# Patient Record
Sex: Female | Born: 1935 | Race: White | Hispanic: No | Marital: Married
Health system: Southern US, Community
[De-identification: ages and names within clinical notes are randomized; demographics above are authoritative.]

## PROBLEM LIST (undated history)

## (undated) DIAGNOSIS — E119 Type 2 diabetes mellitus without complications: Secondary | ICD-10-CM

## (undated) DIAGNOSIS — I509 Heart failure, unspecified: Secondary | ICD-10-CM

## (undated) DIAGNOSIS — J449 Chronic obstructive pulmonary disease, unspecified: Secondary | ICD-10-CM

## (undated) DIAGNOSIS — H9190 Unspecified hearing loss, unspecified ear: Secondary | ICD-10-CM

## (undated) DIAGNOSIS — I252 Old myocardial infarction: Secondary | ICD-10-CM

## (undated) HISTORY — PX: BACK SURGERY: SHX140

## (undated) HISTORY — PX: CORONARY STENT PLACEMENT: SHX1402

## (undated) HISTORY — PX: REPLACEMENT TOTAL KNEE: SUR1224

## (undated) HISTORY — PX: CARDIAC SURGERY: SHX584

---

## 2004-09-18 ENCOUNTER — Inpatient Hospital Stay: Payer: Self-pay | Admitting: General Surgery

## 2006-01-02 ENCOUNTER — Ambulatory Visit: Payer: Self-pay | Admitting: Pediatrics

## 2007-02-12 ENCOUNTER — Ambulatory Visit: Payer: Self-pay | Admitting: *Deleted

## 2007-05-21 ENCOUNTER — Ambulatory Visit: Payer: Self-pay | Admitting: Gastroenterology

## 2008-01-15 ENCOUNTER — Ambulatory Visit: Payer: Self-pay | Admitting: Family Medicine

## 2008-04-12 ENCOUNTER — Ambulatory Visit: Payer: Self-pay | Admitting: Unknown Physician Specialty

## 2008-04-12 ENCOUNTER — Ambulatory Visit: Payer: Self-pay | Admitting: Cardiology

## 2008-04-19 ENCOUNTER — Inpatient Hospital Stay: Payer: Self-pay | Admitting: Unknown Physician Specialty

## 2008-04-25 ENCOUNTER — Encounter: Payer: Self-pay | Admitting: Internal Medicine

## 2009-03-08 ENCOUNTER — Inpatient Hospital Stay: Payer: Self-pay | Admitting: Internal Medicine

## 2009-03-22 ENCOUNTER — Ambulatory Visit: Payer: Self-pay | Admitting: Unknown Physician Specialty

## 2009-05-25 ENCOUNTER — Ambulatory Visit: Payer: Self-pay | Admitting: Family Medicine

## 2009-05-31 ENCOUNTER — Ambulatory Visit: Payer: Self-pay | Admitting: Gastroenterology

## 2009-07-05 ENCOUNTER — Ambulatory Visit: Payer: Self-pay | Admitting: Unknown Physician Specialty

## 2009-07-18 ENCOUNTER — Inpatient Hospital Stay: Payer: Self-pay | Admitting: Unknown Physician Specialty

## 2010-10-05 ENCOUNTER — Ambulatory Visit: Payer: Self-pay | Admitting: Gastroenterology

## 2010-11-22 ENCOUNTER — Ambulatory Visit: Payer: Self-pay | Admitting: Gastroenterology

## 2010-11-29 LAB — PATHOLOGY REPORT

## 2010-12-27 ENCOUNTER — Ambulatory Visit: Payer: Self-pay | Admitting: Family Medicine

## 2011-01-24 ENCOUNTER — Ambulatory Visit: Payer: Self-pay | Admitting: Gastroenterology

## 2011-12-09 ENCOUNTER — Emergency Department: Payer: Self-pay | Admitting: Emergency Medicine

## 2011-12-09 LAB — COMPREHENSIVE METABOLIC PANEL
Alkaline Phosphatase: 126 U/L (ref 50–136)
Anion Gap: 9 (ref 7–16)
Bilirubin,Total: 0.5 mg/dL (ref 0.2–1.0)
Calcium, Total: 8.6 mg/dL (ref 8.5–10.1)
Chloride: 104 mmol/L (ref 98–107)
Co2: 26 mmol/L (ref 21–32)
EGFR (African American): 54 — ABNORMAL LOW
EGFR (Non-African Amer.): 47 — ABNORMAL LOW
Glucose: 190 mg/dL — ABNORMAL HIGH (ref 65–99)
Osmolality: 286 (ref 275–301)
Potassium: 3.9 mmol/L (ref 3.5–5.1)
SGOT(AST): 32 U/L (ref 15–37)
Sodium: 139 mmol/L (ref 136–145)

## 2011-12-09 LAB — TROPONIN I: Troponin-I: <0.02 ng/mL

## 2011-12-09 LAB — CBC
MCHC: 31.5 g/dL — ABNORMAL LOW (ref 32.0–36.0)
MCV: 86 fL (ref 80–100)
Platelet: 283 10*3/uL (ref 150–440)
RDW: 14.9 % — ABNORMAL HIGH (ref 11.5–14.5)
WBC: 7.4 10*3/uL (ref 3.6–11.0)

## 2011-12-09 LAB — URINALYSIS, COMPLETE
Glucose,UR: 50 mg/dL (ref 0–75)
Ketone: NEGATIVE
Nitrite: NEGATIVE
Ph: 5 (ref 4.5–8.0)
Protein: 100
RBC,UR: 1 /HPF (ref 0–5)
Squamous Epithelial: 6
WBC UR: 15 /HPF (ref 0–5)

## 2011-12-09 LAB — CK TOTAL AND CKMB (NOT AT ARMC)
CK, Total: 82 U/L (ref 21–215)
CK-MB: 1.1 ng/mL (ref 0.5–3.6)

## 2012-02-06 ENCOUNTER — Ambulatory Visit: Payer: Self-pay | Admitting: Family Medicine

## 2012-05-02 ENCOUNTER — Emergency Department: Payer: Self-pay | Admitting: Emergency Medicine

## 2012-12-31 ENCOUNTER — Emergency Department: Payer: Self-pay | Admitting: Emergency Medicine

## 2012-12-31 LAB — CBC
HCT: 40 % (ref 35.0–47.0)
HGB: 13.3 g/dL (ref 12.0–16.0)
Platelet: 285 10*3/uL (ref 150–440)
RBC: 4.77 10*6/uL (ref 3.80–5.20)
RDW: 14.6 % — ABNORMAL HIGH (ref 11.5–14.5)
WBC: 9.5 10*3/uL (ref 3.6–11.0)

## 2012-12-31 LAB — COMPREHENSIVE METABOLIC PANEL
Albumin: 3.3 g/dL — ABNORMAL LOW (ref 3.4–5.0)
Alkaline Phosphatase: 136 U/L (ref 50–136)
Anion Gap: 3 — ABNORMAL LOW (ref 7–16)
BUN: 17 mg/dL (ref 7–18)
Bilirubin,Total: 0.5 mg/dL (ref 0.2–1.0)
Calcium, Total: 9.4 mg/dL (ref 8.5–10.1)
Chloride: 104 mmol/L (ref 98–107)
EGFR (Non-African Amer.): 53 — ABNORMAL LOW
Glucose: 147 mg/dL — ABNORMAL HIGH (ref 65–99)
Potassium: 3.7 mmol/L (ref 3.5–5.1)
SGOT(AST): 26 U/L (ref 15–37)
Sodium: 139 mmol/L (ref 136–145)
Total Protein: 7.2 g/dL (ref 6.4–8.2)

## 2012-12-31 LAB — URINALYSIS, COMPLETE
Bacteria: NONE SEEN
Bilirubin,UR: NEGATIVE
Glucose,UR: 50 mg/dL (ref 0–75)
Nitrite: NEGATIVE
Protein: >=500
Specific Gravity: 1.019 (ref 1.003–1.030)
WBC UR: 297 /HPF (ref 0–5)

## 2012-12-31 LAB — TROPONIN I: Troponin-I: <0.02 ng/mL

## 2013-03-30 ENCOUNTER — Ambulatory Visit: Payer: Self-pay | Admitting: Family Medicine

## 2013-04-03 LAB — CBC
HCT: 38.8 % (ref 35.0–47.0)
MCH: 28.6 pg (ref 26.0–34.0)
MCHC: 33.9 g/dL (ref 32.0–36.0)
RBC: 4.61 10*6/uL (ref 3.80–5.20)
RDW: 15.4 % — ABNORMAL HIGH (ref 11.5–14.5)
WBC: 7.1 10*3/uL (ref 3.6–11.0)

## 2013-04-03 LAB — BASIC METABOLIC PANEL
Anion Gap: 4 — ABNORMAL LOW (ref 7–16)
BUN: 23 mg/dL — ABNORMAL HIGH (ref 7–18)
Calcium, Total: 8.8 mg/dL (ref 8.5–10.1)
Co2: 30 mmol/L (ref 21–32)
EGFR (African American): 50 — ABNORMAL LOW
Glucose: 288 mg/dL — ABNORMAL HIGH (ref 65–99)
Sodium: 137 mmol/L (ref 136–145)

## 2013-04-03 LAB — PROTIME-INR
INR: 0.9
Prothrombin Time: 12.5 secs (ref 11.5–14.7)

## 2013-04-03 LAB — APTT: Activated PTT: 27.7 secs (ref 23.6–35.9)

## 2013-04-04 ENCOUNTER — Observation Stay: Payer: Self-pay | Admitting: Internal Medicine

## 2013-04-04 LAB — APTT: Activated PTT: 88.6 secs — ABNORMAL HIGH (ref 23.6–35.9)

## 2013-04-04 LAB — CK TOTAL AND CKMB (NOT AT ARMC)
CK, Total: 74 U/L (ref 21–215)
CK, Total: 62 U/L (ref 21–215)
CK-MB: 1.6 ng/mL (ref 0.5–3.6)
CK-MB: 1.3 ng/mL (ref 0.5–3.6)
CK-MB: 1.3 ng/mL (ref 0.5–3.6)

## 2013-04-04 LAB — PROTIME-INR: INR: 0.9

## 2013-04-05 LAB — CBC WITH DIFFERENTIAL/PLATELET
Basophil #: 0 10*3/uL (ref 0.0–0.1)
Eosinophil %: 2.3 %
HCT: 36.5 % (ref 35.0–47.0)
HGB: 12.5 g/dL (ref 12.0–16.0)
MCH: 28.6 pg (ref 26.0–34.0)
MCV: 83 fL (ref 80–100)
Monocyte #: 0.4 x10 3/mm (ref 0.2–0.9)
Neutrophil #: 4.9 10*3/uL (ref 1.4–6.5)
Neutrophil %: 62.8 %
RDW: 15.3 % — ABNORMAL HIGH (ref 11.5–14.5)
WBC: 7.8 10*3/uL (ref 3.6–11.0)

## 2013-04-05 LAB — APTT: Activated PTT: 101.7 secs — ABNORMAL HIGH (ref 23.6–35.9)

## 2013-04-05 LAB — BASIC METABOLIC PANEL
Anion Gap: 5 — ABNORMAL LOW (ref 7–16)
Calcium, Total: 8.9 mg/dL (ref 8.5–10.1)
Creatinine: 1.04 mg/dL (ref 0.60–1.30)
EGFR (Non-African Amer.): 52 — ABNORMAL LOW
Osmolality: 278 (ref 275–301)
Potassium: 3.3 mmol/L — ABNORMAL LOW (ref 3.5–5.1)

## 2013-04-05 LAB — LIPID PANEL: Triglycerides: 331 mg/dL — ABNORMAL HIGH (ref 0–200)

## 2013-08-04 ENCOUNTER — Emergency Department: Payer: Self-pay | Admitting: Emergency Medicine

## 2013-08-04 LAB — BASIC METABOLIC PANEL
Anion Gap: 5 — ABNORMAL LOW (ref 7–16)
BUN: 16 mg/dL (ref 7–18)
Calcium, Total: 8.7 mg/dL (ref 8.5–10.1)
Chloride: 103 mmol/L (ref 98–107)
Co2: 30 mmol/L (ref 21–32)
Creatinine: 0.89 mg/dL (ref 0.60–1.30)
EGFR (African American): >60
Glucose: 157 mg/dL — ABNORMAL HIGH (ref 65–99)
OSMOLALITY: 280 (ref 275–301)
Potassium: 3.6 mmol/L (ref 3.5–5.1)
Sodium: 138 mmol/L (ref 136–145)

## 2013-08-04 LAB — CBC
HCT: 39.5 % (ref 35.0–47.0)
HGB: 12.9 g/dL (ref 12.0–16.0)
MCH: 27.9 pg (ref 26.0–34.0)
MCHC: 32.6 g/dL (ref 32.0–36.0)
MCV: 86 fL (ref 80–100)
PLATELETS: 195 10*3/uL (ref 150–440)
RBC: 4.62 10*6/uL (ref 3.80–5.20)
RDW: 14.2 % (ref 11.5–14.5)
WBC: 8.6 10*3/uL (ref 3.6–11.0)

## 2013-08-04 LAB — TROPONIN I

## 2014-04-14 ENCOUNTER — Observation Stay: Payer: Self-pay | Admitting: Specialist

## 2014-04-14 LAB — CK-MB
CK-MB: 1.7 ng/mL (ref 0.5–3.6)
CK-MB: 1.6 ng/mL (ref 0.5–3.6)

## 2014-04-14 LAB — CBC
HCT: 37.7 % (ref 35.0–47.0)
HGB: 12.1 g/dL (ref 12.0–16.0)
MCH: 27.6 pg (ref 26.0–34.0)
MCHC: 32.1 g/dL (ref 32.0–36.0)
MCV: 86 fL (ref 80–100)
Platelet: 224 10*3/uL (ref 150–440)
RBC: 4.38 10*6/uL (ref 3.80–5.20)
RDW: 14.3 % (ref 11.5–14.5)
WBC: 6.2 10*3/uL (ref 3.6–11.0)

## 2014-04-14 LAB — COMPREHENSIVE METABOLIC PANEL
ALBUMIN: 3 g/dL — AB (ref 3.4–5.0)
ALK PHOS: 127 U/L — AB
ALT: 20 U/L
ANION GAP: 7 (ref 7–16)
BILIRUBIN TOTAL: 0.5 mg/dL (ref 0.2–1.0)
BUN: 20 mg/dL — ABNORMAL HIGH (ref 7–18)
CALCIUM: 7.9 mg/dL — AB (ref 8.5–10.1)
CO2: 29 mmol/L (ref 21–32)
CREATININE: 1.05 mg/dL (ref 0.60–1.30)
Chloride: 105 mmol/L (ref 98–107)
EGFR (African American): >60
GFR CALC NON AF AMER: 54 — AB
GLUCOSE: 275 mg/dL — AB (ref 65–99)
OSMOLALITY: 294 (ref 275–301)
Potassium: 3.6 mmol/L (ref 3.5–5.1)
SGOT(AST): 14 U/L — ABNORMAL LOW (ref 15–37)
SODIUM: 141 mmol/L (ref 136–145)
TOTAL PROTEIN: 6.2 g/dL — AB (ref 6.4–8.2)

## 2014-04-14 LAB — TROPONIN I
Troponin-I: <0.02 ng/mL
Troponin-I: <0.02 ng/mL

## 2014-04-14 LAB — APTT: ACTIVATED PTT: 25.2 s (ref 23.6–35.9)

## 2014-04-14 LAB — PROTIME-INR
INR: 1
PROTHROMBIN TIME: 12.9 s (ref 11.5–14.7)

## 2014-04-15 LAB — BASIC METABOLIC PANEL
Anion Gap: 6 — ABNORMAL LOW (ref 7–16)
BUN: 21 mg/dL — ABNORMAL HIGH (ref 7–18)
CALCIUM: 7.7 mg/dL — AB (ref 8.5–10.1)
CHLORIDE: 106 mmol/L (ref 98–107)
CREATININE: 1.09 mg/dL (ref 0.60–1.30)
Co2: 30 mmol/L (ref 21–32)
GFR CALC NON AF AMER: 52 — AB
GLUCOSE: 216 mg/dL — AB (ref 65–99)
Osmolality: 293 (ref 275–301)
POTASSIUM: 3.3 mmol/L — AB (ref 3.5–5.1)
SODIUM: 142 mmol/L (ref 136–145)

## 2014-04-15 LAB — CBC WITH DIFFERENTIAL/PLATELET
Basophil #: 0 10*3/uL
Basophil %: 0.4 %
Eosinophil #: 0.2 10*3/uL
Eosinophil %: 3.7 %
HCT: 35.3 %
HGB: 11.4 g/dL — ABNORMAL LOW
Lymphocyte %: 32 %
Lymphs Abs: 1.9 10*3/uL
MCH: 27.8 pg
MCHC: 32.4 g/dL
MCV: 86 fL
Monocyte #: 0.5 10*3/uL
Monocyte %: 7.5 %
Neutrophil #: 3.4 10*3/uL
Neutrophil %: 56.4 %
Platelet: 199 10*3/uL
RBC: 4.12 X10 6/mm 3
RDW: 14.1 %
WBC: 6.1 10*3/uL

## 2014-04-15 LAB — HEMOGLOBIN A1C: Hemoglobin A1C: 10.1 % — ABNORMAL HIGH (ref 4.2–6.3)

## 2014-04-15 LAB — LIPID PANEL
CHOLESTEROL: 157 mg/dL (ref 0–200)
HDL Cholesterol: 27 mg/dL — ABNORMAL LOW (ref 40–60)
Ldl Cholesterol, Calc: 55 mg/dL (ref 0–100)
Triglycerides: 374 mg/dL — ABNORMAL HIGH (ref 0–200)
VLDL CHOLESTEROL, CALC: 75 mg/dL — AB (ref 5–40)

## 2014-04-15 LAB — CK-MB: CK-MB: 1.5 ng/mL (ref 0.5–3.6)

## 2014-04-15 LAB — TROPONIN I

## 2014-09-23 NOTE — Consult Note (Signed)
PATIENT NAME:  Connie Wilkins, Connie Wilkins MR#:  [id number] DATE OF BIRTH:  [birth year]  DATE OF CONSULTATION:  04/04/2013  REFERRING PHYSICIAN:  Leanna Sato, MD, and Ramonita Lab, MD CONSULTING PHYSICIAN:  Shawndra Clute D. Arista Kettlewell, MD  INDICATION:  Chest pain, possible unstable angina, usually goes to [facility].    HISTORY OF PRESENT ILLNESS: The patient is a 81 year old white female with history of stroke, coronary artery disease, status post myocardial infarction in July 2013, status post 2 stents at [facility], currently on Plavix; insulin-dependent diabetic, hypertension, hyperlipidemia, COPD, lives on 2 liters of oxygen, presented to the Emergency Room with chest pain and left arm pain. She states it was stabbing in nature, 8 out of 10 but recurrent, started the day before she came in. It was radiating to the left shoulder. Did not have any nausea or vomiting, diaphoresis, shortness of breath. Pain was, she states, stabbing in nature at times. She is deaf and has trouble communicating, uses sign language. Pain was getting better, as per the patient, when she was lying still but it got progressively worse. She has congenital deafness and uses sign language.  Pain came and went. She had improvement with aspirin and sublingual. EKG did not reveal any new changes. Initial cardiac enzymes were negative. She has a significant risk for unstable angina with known coronary artery disease but at times atypical chest pain symptoms. She was admitted for further evaluation and care. Cardiac enzymes so far have been negative but she has had recurrent left arm and chest pain.   PAST MEDICAL HISTORY: DJD, hypertension, hyperlipidemia, insulin-dependent diabetes, depression, congenital deafness, obesity, known coronary artery disease.   PAST SURGICAL HISTORY: Cholecystectomy, right ear implant, tubal ligation, left total knee, PCI and stent placement, back surgery.   ALLERGIES: None.   SOCIAL HISTORY: Lives at home with her husband and  son. No smoking, no alcohol consumption.   FAMILY HISTORY: Breast cancer, lung cancer, COPD, emphysema.  HOME MEDICATION: Pulmicort twice a day, Plavix 75 a day, oxycodone 5 mg every 8 hours p.r.n., metoprolol succinate 20 mg 2 tablets once a day, lisinopril 10 a day, Lantus 50 units subcu at bedtime, gabapentin 300 twice a day, Bactrim twice a day for urinary tract infection, atorvastatin 80 mg daily, aspirin 81 mg a day, amlodipine 5 a day.   REVIEW OF SYSTEMS: No blackout spells or syncope. Denies nausea or vomiting. No fever, no chills, no sweats. No weight loss. No weight gain.  No hemoptysis or hematemesis. Denies bright red blood per rectum. No vision change or hearing change. Denies sputum production or cough.   PHYSICAL EXAMINATION: VITAL SIGNS: Blood pressure 150/70, pulse 70, respiratory rate 18, afebrile.  HEENT: Normocephalic, atraumatic. Pupils equal, reactive to light. She is congenitally deaf, uses sign language.  NECK: Supple. No JVD, bruits or adenopathy.  LUNGS: Clear to auscultation and percussion. No significant wheeze, rhonchi or rales.   HEART:  Regular rate and rhythm.   ABDOMEN:  Positive bowel sounds. No significant rebound, guarding or tenderness.  EXTREMITIES: Within normal limits.  NEUROLOGIC: Intact.  SKIN: Normal.   LABORATORY AND DIAGNOSTICS: EKG: Normal sinus rhythm, nonspecific ST-T wave changes. CBC was normal. Troponin less than 0.2 x 2. Glucose 288, BUN 23, creatinine 1.02, sodium 137, potassium 3.8, chloride is 102, CO2 18, calcium 8.8. PT/INR normal.  Portable chest negative.   ASSESSMENT:  History of coronary artery disease, possible angina, atypical chest pain, tachycardia, congenital deafness, chronic obstructive pulmonary disease, hypertension, shortness of breath, diabetes, hyperlipidemia,  gastroesophageal reflux disease.   PLAN: Agree with admit. Rule out for myocardial infarction. Follow up cardiac enzymes. Follow-up EKG. If biomarkers are  negative, consider whether functional study or cardiac cath would be necessary depending on recurrent or persistent symptoms. Continue hypertension control. Continue diabetes management. Continue lipid management. Would consider continued GERD therapy with Zantac or Pepcid. Continue Plavix and aspirin status post stent placement. If pain continues, would proceed with cardiac cath since she has known stents over the last year and a half but much of her symptoms are at times atypical and at times typical and she still has significant chest pain and pressure that she says feels like when she had her stents placed. We will base further evaluation on results and any further symptoms.   ____________________________ Bobbie Stackwayne D. Juliann Paresallwood, MD ddc:cs D: 04/05/2013 14:29:25 ET T: 04/05/2013 14:50:20 ET JOB#: [id number]  cc: Nakina Spatz D. Juliann Paresallwood, MD, <Dictator> Alwyn PeaWAYNE D Adalynne Steffensmeier MD ELECTRONICALLY SIGNED 05/04/2013 21:19

## 2014-09-23 NOTE — Discharge Summary (Signed)
PATIENT NAME:  Connie Wilkins, Connie Wilkins MR#:  [id number] DATE OF BIRTH:  05/12/1934  DATE OF ADMISSION:  04/04/2013  DATE OF DISCHARGE:  04/05/2013  ADMISSION DIAGNOSIS: Unstable angina.   DISCHARGE DIAGNOSES: 1.  Chest pain, not cardiac in nature.  2.  Hypertension.  3.  History of deafness.   CONSULTATIONS: Dr. Juliann Paresallwood  PROCEDURES: The patient underwent a cardiac catheterization on 04/05/2013, which essentially showed normal coronary arteries and minor ASCVD, bilateral renal.   PERTINENT LABORATORIES: Troponins x 3 were negative. LDL was 76, VLDL 66, HDL 36. Triglycerides 331, cholesterol 178. Discharge white blood cells 7.8, hemoglobin 12.5, hematocrit 36.5, platelets 202. Sodium 137, potassium 3.3, chloride 104, bicarb 28, BUN 18, creatinine 1.04, glucose 136.   HOSPITAL COURSE: A 81 year old female with known coronary artery disease, who presented with chest pain. For further details, please refer to the H and P.   1.  Chest pain. Initially this was thought to be secondary to unstable angina. The patient was admitted to telemetry. She was started on a heparin drip. She was continued on her other cardiac medications, including Plavix, statin, metoprolol and lisinopril. Her troponins were all negative x 3. I spoke with Dr. Juliann Paresallwood. The patient underwent a cardiac catheterization. Essentially the cardiac catheterization revealed no evidence of any kind of major coronary blockage. He recommended continuing the patient on medications, and his chest pain was not cardiac in nature.   2.  History of coronary artery disease. The patient will continue Plavix, statin, metoprolol, lisinopril.   3.  History of chronic obstructive pulmonary disease. Stable without disease exacerbation.   4.  Diabetes. The patient will continue her outpatient medications.  5.  Hypertension. We did add Imdur for better blood pressure control.   DISCHARGE MEDICATIONS:  1.  Aspirin 81 mg daily.  2.  Lantus 56 units at  bedtime.  3.  Norco 7/325 q. 6 hours p.r.n. pain.  4.  Metoprolol 25 mg 3 tablets daily. 5.  Pulmicort 2 mL b.i.d.  6.  2 mL q. 12 hours.  7.  Atorvastatin 80 mg at bedtime.  8.  Plavix 75 mg daily.  9.  Lisinopril 10 mg daily.  10.  Oxycodone 5 mg q. 8 hours p.r.n. pain.  11.  Gabapentin 300 mg t.i.d.  12.  Bactrim 800/150 b.i.d., which the patient is taking for UTI prior to her hospitalization.  13.  Pyridium 200 mg every 8 hours p.r.n. painful urination.  14.  Imdur 30 mg daily.  15.  Nitroglycerin sublingual p.r.n. chest pain.   DISCHARGE DIET: Low sodium, ADA diet.   DISCHARGE ACTIVITY: As tolerated. No exertion or heavy lifting for one week or until follow up with Dr. Juliann Paresallwood in 1 week, as well as Dr. Darreld McleanLinda Miles, MD   TIME SPENT: Approximately 40 minutes on this discharge.   The patient is medically stable for discharge.     ____________________________ Thornton Dohrmann P. Juliene PinaMody, MD spm:mr D: 04/05/2013 19:24:06 ET T: 04/05/2013 20:13:39 ET JOB#: [id number]  cc: Briyah Wheelwright P. Juliene PinaMody, MD, <Dictator>   Janyth ContesSITAL P Dayshon Roback MD ELECTRONICALLY SIGNED 04/06/2013 15:10

## 2014-09-23 NOTE — H&P (Signed)
PATIENT NAME:  Connie Wilkins, Connie Wilkins MR#:  [id number] DATE OF BIRTH:  03/10/1934  DATE OF ADMISSION:  04/04/2013  PRIMARY CARE PHYSICIAN: Dr. Darreld McleanLinda Miles.   REFERRING PHYSICIAN: Dr. York CeriseForbach.    HISTORY OF PRESENT ILLNESS: The patient is a 81 year old Caucasian female with past medical history of stroke, coronary artery disease status post-acute MI in July 2013 who [facility] 2  [facility] and currently on Plavix, insulin-dependent diabetes mellitus, hypertension, hyperlipidemia, COPD lives on 2 liters of oxygen.  She is presenting to the ER with a chief complaint of chest pain. The patient's chest pain is stabbing in nature which is 8 out of 10, and started some time yesterday. It is radiating to the left shoulder, but she does any nausea, vomiting, diaphoresis or shortness of breath. The pain is stabbing in nature and gets worse with any kind of movements. Pain gets better if the patient is lying still. The patient has congenital deafness and she uses sign language for communication. Son is at bedside and helps with the sign language. The patient's chest pain is initially at 8 out of 10, which was significantly improved after giving aspirin and sublingual nitroglycerin. EKG did not reveal any new changes. The first two sets of cardiac enzymes are negative. However, given the risk factors ER PMS Connie FanningJulie, spoke with on-call cardiology, Dr. Juliann Paresallwood, who suggested to us their clinical  judgment regarding admission. As the patient has high risk for unstable angina, the patient is started on heparin drip given the concerns for morbidity and mortality risks by the ER physician. Hospitalist team is called to admit the patient. During my examination, the patient is resting comfortably and reporting that chest pain is significantly improved and it gets hurt only when she moves her left shoulder. The patient also has reported that she has a chronic history of arthritis, just getting worse recently.   PAST MEDICAL  HISTORY: Degenerative joint disease, hypertension, hyperlipidemia, insulin-dependent diabetes mellitus, depression, congenital deafness, obesity.   PAST SURGICAL HISTORY: Cholecystectomy, right ear implants, tubal ligation, left total knee replacement.  ALLERGIES: No known drug allergies.    PSYCHOSOCIAL HISTORY: Lives at home with husband and son. No history of smoking, alcohol or illicit drug use.   FAMILY HISTORY: The patient's mother has breast cancer and she is deceased with lung cancer. Father died of COPD with emphysema.   HOME MEDICATIONS: Pulmicort Respules inhalation two times, Plavix 75 mg 1 daily,  oxycodone 5 mg q.8 hour p.o., metoprolol succinate 20 mg 2 tablets p.o. once a day, lisinopril 10 mg once daily, Lantus 50 units subcutaneous at bedtime, gabapentin 300 mg p.o. 2 times a day, Bactrim DS 1 tablet by mouth 2 times a day for urinary tract infection, atorvastatin 80 mg once daily, aspirin 81 mg once daily, amlodipine 5 mg once daily,   REVIEW OF SYSTEMS:  CONSTITUTIONAL: Denies any fever or fatigue.  EYES: Denies blurred vision or diplopia.  EARS, NOSE, THROAT: No epistaxis, discharge.  RESPIRATION:  Denies cough. Has chronic history of COPD, lives on 2 liters of oxygen.  CARDIOVASCULAR: Complaining of left-sided chest pain, which is reproducible and gets worse with movement of the shoulder. Denies any palpitations, syncope.  GASTROINTESTINAL: Denies nausea, vomiting, diarrhea.  GENITOURINARY: No dysuria or hematuria.  GYNECOLOGIC AND BREASTS: Denies breast mass or vaginal discharge.  ENDOCRINE: Denies polyuria, nocturia or thyroid problems.  HEMATOLOGIC AND LYMPHATIC: No anemia, easy bruising or bleeding.  INTEGUMENTARY: No skin lesions.   MUSCULOSKELETAL: Complaining of left  shoulder pain which is chronic in nature. Denies any back pain. NEUROLOGICAL: No history of vertigo or ataxia.  PSYCHIATRIC: No history of ADD or OCD.   PHYSICAL EXAMINATION:  VITAL SIGNS:  Temperature 97.7, pulse 68, respirations 20, blood pressure 150/74, pulse oximetry 95%.  GENERAL APPEARANCE: Not in acute distress. Moderately built and nourished.  HEENT: Normocephalic, atraumatic. Pupils are equal, reacting to light and accommodation. No scleral icterus. No conjunctival injection. No sinus tenderness. Moist mucous membranes.  NECK: Supple. No JVD. Range of motion is intact.   LUNGS: Clear to auscultation bilaterally. No accessory muscle usage. Positive reproducible anterior chest wall tenderness the left side of the chest.  CARDIAC: S1, S2 normal. Regular rate and rhythm.  GASTROINTESTINAL: Soft, obese. Bowel sounds are positive in all four quadrants. Nontender, nondistended. No masses felt. No hepatosplenomegaly.  NEUROLOGIC: Awake, alert, oriented x 3, communicating well with sign language. MUSCULOSKELETAL: No joint effusion or erythema. Left shoulder is tender. Range of motion is relatively decreased in view of osteoarthritis.  EXTREMITIES: No edema. No cyanosis. No clubbing Peripheral pulses are 2+.  IMAGING STUDIES: A 12-lead EKG normal sinus rhythm with PR interval at 202, but no new changes in EKG when compared with the old EKG. No acute ST wave changes. CBC normal. Troponin less than 0.02 x 2. Glucose 288, BUN 23, creatinine 1.02, sodium 137, potassium 3.8, chloride 102, CO2 18, anion gap 4, GFR of 43, serum osmolality 288. Calcium 8.8. PT of 0.5, INR 0.9, PTT 27.3. Portable chest x-ray has revealed no acute cardiopulmonary disease   ASSESSMENT AND PLAN: A 81 year old female with congenital deafness using sign language is presenting with left anterior chest wall, reproducible chest pain since yesterday. Will be admitted with the following assessment and plan.  1.  Atypical chest pain, probably unstable angina has had pain improved with sublingual nitroglycerin, aspirin. We will admit her to telemetry given the risk factors.  2.  The patient will be on acute coronary  syndrome protocol with oxygen, nitroglycerin, aspirin 81 mg, Plavix, statin and beta blocker.  3.   Tachycardia biomarkers.  4.  The patient is started on heparin drip by the ER physician, as there was a concern for unstable angina. Cardiology consult is placed to Dr. Juliann Pares. As per the ER physician's discussion with Dr. Juliann Pares. 5.  History of myocardial infarction, status post two stents at [facility]. We will continue aspirin Plavix and the patient will be continued on beta blocker and statin.  6.   Chronic obstructive pulmonary disease. 7.   Chronic obstructive pulmonary disease exacerbation. Continue 2 liters of oxygen.  8.  Insulin-dependent diabetes mellitus. Continue Lantus and the patient will have sliding scale insulin.  9.  Hypertension. Resume her home medications. Monitor and up-titrate as needed basis.  10.  Hyperlipidemia. Continue statin.  11.  Gastrointestinal prophylaxis.   12.  Deep vein thrombosis prophylaxis. The patient is currently on heparin drip.   CODE STATUS: She is full code. Husband is the medical power of attorney.   The diagnosis  and plan of care was discussed in detail in sign language with the patient.   Total time spent was: 45 minutes.    ____________________________ Ramonita Lab, MD ag:NTS D: 04/04/2013 01:30:37 ET T: 04/04/2013 02:39:55 ET JOB#: [id number]  cc: Ramonita Lab, MD, <Dictator> Lamar Blinks, MD Ramonita Lab MD ELECTRONICALLY SIGNED 04/18/2013 8:06

## 2014-09-24 NOTE — Discharge Summary (Signed)
PATIENT NAME:  Connie Wilkins, Connie Wilkins MR#:  [id number] DATE OF BIRTH:  07/18/1933  DATE OF ADMISSION:  04/14/2014 DATE OF DISCHARGE:  04/15/2014  For a detailed note, please see the history and physical done on admission by Dr. Elby Showersatherine Walsh.   DIAGNOSES AT DISCHARGE: As follows, chest pain, likely musculoskeletal in nature; hypertension, history of previous coronary disease, uncontrolled diabetes, diabetic neuropathy.   The patient is being discharged on a low-sodium, low-fat, carbohydrate-controlled diet.   ACTIVITY: As tolerated.   FOLLOWUPLamar Blinks:  Bruce J Kowalski, MD, in the next 1 week; also follow up with Dr. Leanna SatoLinda M Miles, MD in the next 1 week.   DISCHARGE MEDICATIONS: Lantus 56 units at bedtime, Toprol 25 mg 3 tabs daily, atorvastatin 80 mg daily, lisinopril 10 mg daily, gabapentin 300 mg t.i.d., Imdur 30 mg daily, aspirin 81 mg daily, sublingual nitroglycerin as needed.   BRIEF HOSPITAL COURSE: This is a 81 year old female with medical problems as mentioned above, presented to the hospital with chest pain.  Problem: 1.  Chest pain. The patient's symptoms were suspicious for angina. She does have significant risk factors given her previous history of coronary disease and stent placement. She was therefore observed overnight on telemetry, had 3 sets of cardiac markers checked, which were negative. She had no evidence of an acute arrhythmia on telemetry. Since the patient is symptom-free now she is being discharged on her aspirin, statin, beta blocker and Imdur and for follow-up her cardiologist, Dr. Gwen PoundsKowalski next week.  2.  Hypertension. The patient is to continue her metoprolol, lisinopril and Imdur.    3.  Hyperlipidemia. The patient was maintained on atorvastatin, she will resume that.  4.  Diabetes. The patient's hemoglobin A1c was 10, but this was likely secondary to poor dietary control. At this time, she will continue her Levemir and further titrations to it and to diabetic regimen can be  done by her primary care physician.  5.  Diabetic neuropathy. The patient was maintained on Neurontin, she will resume that.   CODE STATUS: The patient is a full code.   DISPOSITION: She is being discharged home.   TIME SPENT: Was 40 minutes.    ____________________________ Rolly PancakeVivek J. Cherlynn KaiserSainani, MD vjs:nt D: 04/15/2014 15:25:45 ET T: 04/15/2014 20:42:03 ET JOB#: [id number]  cc: Rolly PancakeVivek J. Cherlynn KaiserSainani, MD, <Dictator> Lamar BlinksBruce J. Kowalski, MD Leanna SatoLinda M. Miles, MD  Houston SirenVIVEK J Vida Nicol MD ELECTRONICALLY SIGNED 04/22/2014 12:45

## 2014-09-24 NOTE — H&P (Signed)
PATIENT NAME:  Connie Wilkins, Connie Wilkins MR#:  [id number] DATE OF BIRTH:  Sep 16, 1933  DATE OF ADMISSION:  04/14/2014  PRIMARY CARE PHYSICIAN:  Dr. Darreld Mclean.    REFERRING EMERGENCY ROOM PHYSICIAN:  Dr. Inocencio Homes   PRESENTING COMPLAINT: Chest pain.   HISTORY OF PRESENT ILLNESS:  This very pleasant 81 year old woman with a past medical history of stroke, coronary artery disease status post MI in July of 2013, status post PCI with stent placement, insulin-dependent diabetes, hypertension, hyperlipidemia, COPD on 2 liters of oxygen, presents today with chest pain. History is obtained with the use of a sign language interpreter and in conjunction with history from her son who is present and lives with her. The patient reports chest pain of 2 days duration, a squeezing pain in the substernal area. She has had episodic diaphoresis. No nausea or vomiting. She does have some increasing shortness of breath worse than usual with cough and some wheezing with sputum production. Chest pain is fairly constant, nothing makes it better or worse. She states that the chest pain is very similar to the pain she experienced when she had her MI in 2013. Chest pain has improved significantly in the Emergency Room and at the time of this interview she is not having any pain. EKG with no changes. Cardiac enzymes are negative. She is being admitted for ACS rule out.   PAST MEDICAL HISTORY:  1.  Coronary artery disease status post MRI and status post PCI in 2013 with 2 stents placed at [facility].  2.  History of CVA.   3.  Insulin-dependent diabetes mellitus.  4.  Hypertension.  5.  Hyperlipidemia.  6.  COPD.   7.  Chronic respiratory failure with hypoxia requiring 2 liters of nasal cannula oxygen.  8.  Depression.  9.  Congenital deafness.  10.  Obesity.  11.  Hyperlipidemia.   PAST SURGICAL HISTORY:  1.  Cholecystectomy.  2.  Right ear implant.  3.  Tubal ligation.  4.  Left total knee replacement.   ALLERGIES: No known drug  allergies.   SOCIAL HISTORY: The patient lives at home with her son and her husband. She is on chronic nasal cannula oxygen. She uses a walker for mobility. She does not drink alcohol, smoke cigarettes, or use any illicit substances. She does have chronic secondhand smoke exposure from her husband who smokes constantly.   FAMILY HISTORY: The patient's father died of COPD. Her son has coronary artery disease and also a valvular disorder requiring valve replacement. Mother had breast cancer.   HOME MEDICATIONS:   Note that this list seems incomplete. The list was obtained from her son.   1. Nitroglycerin 0.4 mg 1 tablet sublingual every 5 minutes as needed for chest pain x 3.  2. Metoprolol succinate 25 mg 3 tablets once a day.  3. Lisinopril 10 mg 1 tablet once a day.  4. Lantus SoloSTAR injector 56 units subcutaneously once a day.  5. Isosorbide mononitrate 30 mg 1 tablet once a day.  6. Gabapentin 300 mg 1 capsule 3 times a day.  7. Atorvastatin 80 mg 1 tablet once a day.  8. Aspirin 81 mg once a day.    Note that there are no inhalers for her COPD and Plavix is missing. This medication reconciliation will need to be revisited in the morning when her pharmacy and doctor's office are open.    REVIEW OF SYSTEMS:  GENERAL: Positive for fatigue, negative for fevers or chills, negative for weight gain.  HEENT: No change in vision or hearing, she has congenital deafness, no pain in eyes or ears, no difficulty swallowing.  PULMONARY: She has chronic COPD requiring oxygen, notes increasing shortness of breath with exertion, cough, wheezing, sputum production.  CARDIOVASCULAR: Positive for chest pain at rest and with exertion, no palpitations, no syncope, no orthopnea, no lower extremity edema.  GASTROINTESTINAL: No nausea, vomiting, or diarrhea, no abdominal pain.  GENITOURINARY: No frequency or dysuria.  MUSCULOSKELETAL: No swollen or tender joints, no recent weakness.  NEUROLOGIC: Positive for  congenital deafness, no recent confusion, change in memory, positive for history of stroke, no focal weakness or numbness.  PSYCHIATRIC: No change in mood, positive for history of depression.   PHYSICAL EXAMINATION:  VITAL SIGNS: Temperature 97.8, pulse 64, respirations 16, blood pressure 143/120, oxygenation 96% on 2 liters nasal cannula.  GENERAL: No acute distress.  HEENT: Pupils are equal, round, and reactive, conjunctivae are clear, extraocular motion is intact, mucous membranes are pink and moist, posterior oropharynx is clear.  NECK: Supple, thyroid nontender.  PULMONARY: There are diffuse rhonchi and wheezes, good air movement, very difficult to get her to participate in the respiratory examination, fair air movement.  CARDIOVASCULAR: Distant heart sounds, regular rate and rhythm, no murmurs, rubs, or gallops, no peripheral edema, peripheral pulses are 1 +.  ABDOMEN: Distended, soft, nontender, obese, bowel sounds are normal.  MUSCULOSKELETAL: No warm or swollen joints, range of motion is normal in all joints, strength 5 out of 5 throughout.  NEUROLOGIC: She is deaf, otherwise cranial nerves II through XII are grossly intact, strength and sensation are intact bilaterally, nonfocal neurologic examination.  PSYCHIATRIC: With the aid of the sign language interpreter she is alert and oriented x 4, no signs of uncontrolled depression or anxiety.   LABORATORY DATA: Sodium 141, potassium 3.6, chloride 105, bicarbonate 29, BUN 20, creatinine 1.05, glucose 275, total protein 6.2, albumin 3.0, bilirubin 0.5, alkaline phosphatase 127, AST 14, ALT 20. First troponin negative at less than 0.02. White blood cells 6.2, hemoglobin 12.1, platelets 224,000, MCV 86.   IMAGING: Chest x-ray, no acute abnormalities.   ASSESSMENT AND PLAN:  1.  Chest pain concern for unstable angina: Admit to telemetry. Cycle cardiac enzymes, repeat chest x-ray in the morning, continue with statin, beta blocker. She has  received high-dose aspirin in the Emergency Room, will continue on aspirin 81 mg daily. At this point will not start full anticoagulation as she is chest pain-free and troponins are negative with no EKG changes. Continue with nitroglycerin p.r.n. for pain. Continue Imdur.   2.  History of coronary artery disease: She has had a stent in 2013, formerly on Plavix, currently it seems that she is only on aspirin and statin. She also has a history of CVA. Her medication list should be reviewed with her primary care physician.  3.  Chronic obstructive pulmonary disease with oxygen dependence: Medication list does not include any inhalers. I will start nebulizer treatments. Medication list should be reviewed in the morning as she is likely on some maintenance medications for COPD.  Chest x-ray is clear. She does not show signs of having an acute COPD exacerbation at this time.  4.  Diabetes mellitus, type 2 insulin-dependent: We will check hemoglobin A1c. We will continue with long-acting insulin as well as sliding scale.  5.  Hypertension: Continue home regimen. Currently well controlled. Provide hydralazine p.r.n. 6.  Hyperlipidemia: Continue statin therapy.  7.  Depression: Stable at this time, she is alert, oriented, and  interactive, seems very comfortable: She is not on any medications for depression that I can tell.  8.  Congenital deafness: This patient will need a sign language interpreter for conversation. 9.  Prophylaxis: Heparin prophylaxis with Lovenox, no GI prophylaxis as she is not acutely ill.    TIME SPENT ON ADMISSION: 45 minutes.     ____________________________ Ena Dawley. Clent Ridges, MD cpw:bu D: 04/14/2014 19:14:20 ET T: 04/14/2014 19:52:43 ET JOB#: [id number]  cc: Santina Evans P. Clent Ridges, MD, <Dictator> Gale Journey MD ELECTRONICALLY SIGNED 04/15/2014 11:53

## 2015-09-26 ENCOUNTER — Ambulatory Visit
Admission: RE | Admit: 2015-09-26 | Discharge: 2015-09-26 | Disposition: A | Discharge status: Home / Self Care | Payer: Medicare HMO | Source: Ambulatory Visit | Attending: Family Medicine | Admitting: Family Medicine

## 2015-09-26 ENCOUNTER — Other Ambulatory Visit: Payer: Self-pay | Admitting: Family Medicine

## 2015-09-26 DIAGNOSIS — M79642 Pain in left hand: Secondary | ICD-10-CM

## 2015-09-26 DIAGNOSIS — M189 Osteoarthritis of first carpometacarpal joint, unspecified: Secondary | ICD-10-CM | POA: Insufficient documentation

## 2016-01-10 ENCOUNTER — Encounter: Payer: Self-pay | Admitting: *Deleted

## 2016-01-10 ENCOUNTER — Inpatient Hospital Stay
Admission: EM | Admit: 2016-01-10 | Discharge: 2016-01-12 | DRG: 190 | Disposition: A | Discharge status: Home Health | Payer: Medicare HMO | Attending: Internal Medicine | Admitting: Internal Medicine

## 2016-01-10 ENCOUNTER — Emergency Department: Payer: Medicare HMO

## 2016-01-10 DIAGNOSIS — Z79899 Other long term (current) drug therapy: Secondary | ICD-10-CM

## 2016-01-10 DIAGNOSIS — Z955 Presence of coronary angioplasty implant and graft: Secondary | ICD-10-CM

## 2016-01-10 DIAGNOSIS — R079 Chest pain, unspecified: Secondary | ICD-10-CM

## 2016-01-10 DIAGNOSIS — Z794 Long term (current) use of insulin: Secondary | ICD-10-CM | POA: Diagnosis not present

## 2016-01-10 DIAGNOSIS — I509 Heart failure, unspecified: Secondary | ICD-10-CM | POA: Diagnosis present

## 2016-01-10 DIAGNOSIS — J441 Chronic obstructive pulmonary disease with (acute) exacerbation: Secondary | ICD-10-CM | POA: Diagnosis present

## 2016-01-10 DIAGNOSIS — I110 Hypertensive heart disease with heart failure: Secondary | ICD-10-CM | POA: Diagnosis present

## 2016-01-10 DIAGNOSIS — E1140 Type 2 diabetes mellitus with diabetic neuropathy, unspecified: Secondary | ICD-10-CM | POA: Diagnosis present

## 2016-01-10 DIAGNOSIS — I2510 Atherosclerotic heart disease of native coronary artery without angina pectoris: Secondary | ICD-10-CM | POA: Diagnosis present

## 2016-01-10 DIAGNOSIS — E785 Hyperlipidemia, unspecified: Secondary | ICD-10-CM | POA: Diagnosis present

## 2016-01-10 DIAGNOSIS — J9621 Acute and chronic respiratory failure with hypoxia: Secondary | ICD-10-CM | POA: Diagnosis present

## 2016-01-10 DIAGNOSIS — Z9981 Dependence on supplemental oxygen: Secondary | ICD-10-CM | POA: Diagnosis not present

## 2016-01-10 DIAGNOSIS — J440 Chronic obstructive pulmonary disease with acute lower respiratory infection: Secondary | ICD-10-CM | POA: Diagnosis present

## 2016-01-10 DIAGNOSIS — E039 Hypothyroidism, unspecified: Secondary | ICD-10-CM | POA: Diagnosis present

## 2016-01-10 DIAGNOSIS — R0902 Hypoxemia: Secondary | ICD-10-CM | POA: Diagnosis present

## 2016-01-10 DIAGNOSIS — Z7982 Long term (current) use of aspirin: Secondary | ICD-10-CM | POA: Diagnosis not present

## 2016-01-10 DIAGNOSIS — E1165 Type 2 diabetes mellitus with hyperglycemia: Secondary | ICD-10-CM | POA: Diagnosis present

## 2016-01-10 DIAGNOSIS — I252 Old myocardial infarction: Secondary | ICD-10-CM

## 2016-01-10 DIAGNOSIS — T380X5A Adverse effect of glucocorticoids and synthetic analogues, initial encounter: Secondary | ICD-10-CM | POA: Diagnosis present

## 2016-01-10 DIAGNOSIS — H9190 Unspecified hearing loss, unspecified ear: Secondary | ICD-10-CM | POA: Diagnosis present

## 2016-01-10 DIAGNOSIS — J209 Acute bronchitis, unspecified: Secondary | ICD-10-CM | POA: Diagnosis present

## 2016-01-10 HISTORY — DX: Type 2 diabetes mellitus without complications: E11.9

## 2016-01-10 HISTORY — DX: Chronic obstructive pulmonary disease, unspecified: J44.9

## 2016-01-10 HISTORY — DX: Old myocardial infarction: I25.2

## 2016-01-10 LAB — CBC WITH DIFFERENTIAL/PLATELET
BASOS PCT: 1 %
Basophils Absolute: 0.1 10*3/uL (ref 0–0.1)
EOS ABS: 0.5 10*3/uL (ref 0–0.7)
Eosinophils Relative: 5 %
HCT: 35.5 % (ref 35.0–47.0)
HEMOGLOBIN: 12.3 g/dL (ref 12.0–16.0)
Lymphocytes Relative: 19 %
Lymphs Abs: 1.8 10*3/uL (ref 1.0–3.6)
MCH: 29.1 pg (ref 26.0–34.0)
MCHC: 34.7 g/dL (ref 32.0–36.0)
MCV: 83.9 fL (ref 80.0–100.0)
MONOS PCT: 7 %
Monocytes Absolute: 0.7 10*3/uL (ref 0.2–0.9)
NEUTROS PCT: 68 %
Neutro Abs: 6.4 10*3/uL (ref 1.4–6.5)
PLATELETS: 293 10*3/uL (ref 150–440)
RBC: 4.23 MIL/uL (ref 3.80–5.20)
RDW: 15.2 % — ABNORMAL HIGH (ref 11.5–14.5)
WBC: 9.4 10*3/uL (ref 3.6–11.0)

## 2016-01-10 LAB — BASIC METABOLIC PANEL
Anion gap: 8 (ref 5–15)
BUN: 29 mg/dL — ABNORMAL HIGH (ref 6–20)
CHLORIDE: 106 mmol/L (ref 101–111)
CO2: 27 mmol/L (ref 22–32)
CREATININE: 1.21 mg/dL — AB (ref 0.44–1.00)
Calcium: 9 mg/dL (ref 8.9–10.3)
GFR, EST AFRICAN AMERICAN: 48 mL/min — AB (ref >60)
GFR, EST NON AFRICAN AMERICAN: 41 mL/min — AB (ref >60)
Glucose, Bld: 143 mg/dL — ABNORMAL HIGH (ref 65–99)
Potassium: 4.1 mmol/L (ref 3.5–5.1)
SODIUM: 141 mmol/L (ref 135–145)

## 2016-01-10 LAB — GLUCOSE, CAPILLARY
GLUCOSE-CAPILLARY: 407 mg/dL — AB (ref 65–99)
GLUCOSE-CAPILLARY: 410 mg/dL — AB (ref 65–99)

## 2016-01-10 LAB — TROPONIN I

## 2016-01-10 LAB — BRAIN NATRIURETIC PEPTIDE: B NATRIURETIC PEPTIDE 5: 171 pg/mL — AB (ref 0.0–100.0)

## 2016-01-10 MED ORDER — IPRATROPIUM-ALBUTEROL 0.5-2.5 (3) MG/3ML IN SOLN
3.0000 mL | Freq: Once | RESPIRATORY_TRACT | Status: AC
  Administered 2016-01-10: 3 mL via RESPIRATORY_TRACT
  Filled 2016-01-10: qty 3

## 2016-01-10 MED ORDER — LEVOTHYROXINE SODIUM 25 MCG PO TABS
25.0000 ug | ORAL_TABLET | Freq: Every day | ORAL | Status: DC
  Administered 2016-01-11 – 2016-01-12 (×2): 25 ug via ORAL
  Filled 2016-01-10 (×2): qty 1

## 2016-01-10 MED ORDER — INSULIN ASPART 100 UNIT/ML ~~LOC~~ SOLN
0.0000 [IU] | Freq: Three times a day (TID) | SUBCUTANEOUS | Status: DC
  Administered 2016-01-10: 15 [IU] via SUBCUTANEOUS
  Administered 2016-01-11 (×2): 11 [IU] via SUBCUTANEOUS
  Administered 2016-01-11 (×2): 15 [IU] via SUBCUTANEOUS
  Administered 2016-01-12 (×2): 3 [IU] via SUBCUTANEOUS
  Filled 2016-01-10: qty 11
  Filled 2016-01-10: qty 3
  Filled 2016-01-10 (×2): qty 15
  Filled 2016-01-10: qty 3
  Filled 2016-01-10: qty 11
  Filled 2016-01-10: qty 15

## 2016-01-10 MED ORDER — GABAPENTIN 300 MG PO CAPS
600.0000 mg | ORAL_CAPSULE | Freq: Every day | ORAL | Status: DC
  Administered 2016-01-10 – 2016-01-11 (×2): 600 mg via ORAL
  Filled 2016-01-10 (×2): qty 2

## 2016-01-10 MED ORDER — IPRATROPIUM-ALBUTEROL 0.5-2.5 (3) MG/3ML IN SOLN
3.0000 mL | Freq: Four times a day (QID) | RESPIRATORY_TRACT | Status: DC
  Administered 2016-01-10 – 2016-01-11 (×5): 3 mL via RESPIRATORY_TRACT
  Filled 2016-01-10 (×6): qty 3

## 2016-01-10 MED ORDER — HYDRALAZINE HCL 20 MG/ML IJ SOLN
10.0000 mg | Freq: Four times a day (QID) | INTRAMUSCULAR | Status: DC | PRN
  Administered 2016-01-10 – 2016-01-11 (×2): 10 mg via INTRAVENOUS
  Filled 2016-01-10 (×2): qty 1

## 2016-01-10 MED ORDER — METHYLPREDNISOLONE SODIUM SUCC 40 MG IJ SOLR
40.0000 mg | Freq: Four times a day (QID) | INTRAMUSCULAR | Status: DC
  Administered 2016-01-10 – 2016-01-11 (×3): 40 mg via INTRAVENOUS
  Filled 2016-01-10 (×3): qty 1

## 2016-01-10 MED ORDER — GABAPENTIN 600 MG PO TABS
600.0000 mg | ORAL_TABLET | Freq: Every day | ORAL | Status: DC
  Filled 2016-01-10: qty 1

## 2016-01-10 MED ORDER — METOPROLOL TARTRATE 25 MG PO TABS
75.0000 mg | ORAL_TABLET | ORAL | Status: DC
  Administered 2016-01-11 – 2016-01-12 (×2): 75 mg via ORAL
  Filled 2016-01-10 (×2): qty 1

## 2016-01-10 MED ORDER — GLIPIZIDE ER 2.5 MG PO TB24
5.0000 mg | ORAL_TABLET | Freq: Every day | ORAL | Status: DC
  Administered 2016-01-11: 5 mg via ORAL
  Filled 2016-01-10: qty 2

## 2016-01-10 MED ORDER — IBUPROFEN 800 MG PO TABS: 800.0000 mg | ORAL_TABLET | Freq: Four times a day (QID) | ORAL | Status: DC | PRN

## 2016-01-10 MED ORDER — HYDRALAZINE HCL 20 MG/ML IJ SOLN
INTRAMUSCULAR | Status: AC
  Filled 2016-01-10: qty 1

## 2016-01-10 MED ORDER — ONDANSETRON HCL 4 MG PO TABS: 4.0000 mg | ORAL_TABLET | Freq: Four times a day (QID) | ORAL | Status: DC | PRN

## 2016-01-10 MED ORDER — ONDANSETRON HCL 4 MG/2ML IJ SOLN: 4.0000 mg | Freq: Four times a day (QID) | INTRAMUSCULAR | Status: DC | PRN

## 2016-01-10 MED ORDER — INSULIN ASPART 100 UNIT/ML ~~LOC~~ SOLN
15.0000 [IU] | Freq: Three times a day (TID) | SUBCUTANEOUS | Status: DC
  Administered 2016-01-11: 15 [IU] via SUBCUTANEOUS
  Filled 2016-01-10: qty 15

## 2016-01-10 MED ORDER — LISINOPRIL 20 MG PO TABS
40.0000 mg | ORAL_TABLET | Freq: Every day | ORAL | Status: DC
  Administered 2016-01-11 – 2016-01-12 (×2): 40 mg via ORAL
  Filled 2016-01-10 (×2): qty 2

## 2016-01-10 MED ORDER — ASPIRIN EC 81 MG PO TBEC
81.0000 mg | DELAYED_RELEASE_TABLET | Freq: Every day | ORAL | Status: DC
  Administered 2016-01-11 – 2016-01-12 (×2): 81 mg via ORAL
  Filled 2016-01-10 (×2): qty 1

## 2016-01-10 MED ORDER — ATORVASTATIN CALCIUM 20 MG PO TABS
80.0000 mg | ORAL_TABLET | Freq: Every day | ORAL | Status: DC
  Administered 2016-01-10 – 2016-01-11 (×2): 80 mg via ORAL
  Filled 2016-01-10 (×3): qty 4

## 2016-01-10 MED ORDER — METHYLPREDNISOLONE SODIUM SUCC 125 MG IJ SOLR
125.0000 mg | Freq: Once | INTRAMUSCULAR | Status: AC
  Administered 2016-01-10: 125 mg via INTRAVENOUS
  Filled 2016-01-10: qty 2

## 2016-01-10 MED ORDER — FUROSEMIDE 40 MG PO TABS
20.0000 mg | ORAL_TABLET | Freq: Every day | ORAL | Status: DC
  Administered 2016-01-11 – 2016-01-12 (×2): 20 mg via ORAL
  Filled 2016-01-10 (×2): qty 1

## 2016-01-10 MED ORDER — ENOXAPARIN SODIUM 40 MG/0.4ML ~~LOC~~ SOLN
40.0000 mg | SUBCUTANEOUS | Status: DC
  Administered 2016-01-10 – 2016-01-11 (×2): 40 mg via SUBCUTANEOUS
  Filled 2016-01-10 (×2): qty 0.4

## 2016-01-10 MED ORDER — BUDESONIDE 0.25 MG/2ML IN SUSP
0.2500 mg | Freq: Two times a day (BID) | RESPIRATORY_TRACT | Status: DC
  Administered 2016-01-10 – 2016-01-12 (×4): 0.25 mg via RESPIRATORY_TRACT
  Filled 2016-01-10 (×4): qty 2

## 2016-01-10 MED ORDER — ACETAMINOPHEN 650 MG RE SUPP: 650.0000 mg | Freq: Four times a day (QID) | RECTAL | Status: DC | PRN

## 2016-01-10 MED ORDER — ACETAMINOPHEN 325 MG PO TABS
650.0000 mg | ORAL_TABLET | Freq: Four times a day (QID) | ORAL | Status: DC | PRN
  Administered 2016-01-10 – 2016-01-11 (×3): 650 mg via ORAL
  Filled 2016-01-10 (×3): qty 2

## 2016-01-10 MED ORDER — INSULIN DETEMIR 100 UNIT/ML ~~LOC~~ SOLN
40.0000 [IU] | Freq: Every day | SUBCUTANEOUS | Status: DC
  Administered 2016-01-10: 40 [IU] via SUBCUTANEOUS
  Filled 2016-01-10 (×3): qty 0.4

## 2016-01-10 MED ORDER — AMLODIPINE BESYLATE 5 MG PO TABS
5.0000 mg | ORAL_TABLET | Freq: Every day | ORAL | Status: DC
  Administered 2016-01-10 – 2016-01-12 (×3): 5 mg via ORAL
  Filled 2016-01-10 (×3): qty 1

## 2016-01-10 MED ORDER — CLOPIDOGREL BISULFATE 75 MG PO TABS
75.0000 mg | ORAL_TABLET | Freq: Every day | ORAL | Status: DC
  Administered 2016-01-11 – 2016-01-12 (×2): 75 mg via ORAL
  Filled 2016-01-10 (×2): qty 1

## 2016-01-10 MED ORDER — INSULIN ASPART 100 UNIT/ML ~~LOC~~ SOLN: 0.0000 [IU] | Freq: Three times a day (TID) | SUBCUTANEOUS | Status: DC

## 2016-01-10 MED ORDER — ISOSORBIDE MONONITRATE ER 60 MG PO TB24
60.0000 mg | ORAL_TABLET | Freq: Every day | ORAL | Status: DC
  Administered 2016-01-10 – 2016-01-11 (×2): 60 mg via ORAL
  Filled 2016-01-10 (×2): qty 1

## 2016-01-10 NOTE — ED Provider Notes (Addendum)
[facility] Emergency Department Provider Note        Time seen: ----------------------------------------- 1:11 PM on 01/10/2016 -----------------------------------------    I have reviewed the triage vital signs and the nursing notes.   HISTORY  Chief Complaint Chest Pain and Shortness of Breath    HPI Connie Wilkins is a 82 y.o. female who presents to ER for chest pain and difficulty breathing for the last 2 days. Family states the pain is likely due from shortness of breath and cough. She does have a history of cardiac stents, has mid chest pain. She is deaf and requires sign language interpreter. She denies fever or chills, denies other complaints at this time.   Past Medical History:  Diagnosis Date  . COPD (chronic obstructive pulmonary disease) (HCC)   . Diabetes mellitus without complication (HCC)   . MI, old     There are no active problems to display for this patient.   Past Surgical History:  Procedure Laterality Date  . CORONARY STENT PLACEMENT      Allergies Review of patient's allergies indicates no known allergies.  Social History Social History  Substance Use Topics  . Smoking status: Passive Smoke Exposure - Never Smoker  . Smokeless tobacco: Not on file  . Alcohol use Not on file    Review of Systems Constitutional: Negative for fever. Cardiovascular: Positive for chest pain Respiratory: Positive shortness of breath Gastrointestinal: Negative for abdominal pain, vomiting and diarrhea. Genitourinary: Negative for dysuria. Musculoskeletal: Negative for back pain. Skin: Negative for rash. Neurological: Negative for headaches, focal weakness or numbness.  10-point ROS otherwise negative.  ____________________________________________   PHYSICAL EXAM:  VITAL SIGNS: ED Triage Vitals  Enc Vitals Group     BP 01/10/16 1253 (!) 145/97     Pulse Rate 01/10/16 1253 71     Resp 01/10/16 1253 18     Temp 01/10/16  1253 98.4 F (36.9 C)     Temp Source 01/10/16 1253 Oral     SpO2 01/10/16 1253 94 %     Weight 01/10/16 1253 220 lb (99.8 kg)     Height 01/10/16 1253 5\' 3"  (1.6 m)     Head Circumference --      Peak Flow --      Pain Score 01/10/16 1254 7     Pain Loc --      Pain Edu? --      Excl. in GC? --     Constitutional: Alert and oriented. Well appearing and in no distress. Eyes: Conjunctivae are normal. PERRL. Normal extraocular movements. ENT   Head: Normocephalic and atraumatic.   Nose: No congestion/rhinnorhea.   Mouth/Throat: Mucous membranes are moist.   Neck: No stridor. Cardiovascular: Normal rate, regular rhythm. No murmurs, rubs, or gallops. Respiratory: Tachypnea with wheezing bilaterally. Gastrointestinal: Soft and nontender. Normal bowel sounds Musculoskeletal: Nontender with normal range of motion in all extremities. No lower extremity tenderness nor edema. Neurologic:  Normal speech and language. No gross focal neurologic deficits are appreciated.  Skin:  Skin is warm, dry and intact. No rash noted. Psychiatric: Mood and affect are normal. Speech and behavior are normal.  ____________________________________________  EKG: Interpreted by me. Normal sinus rhythm rate of 73 bpm, normal PR interval, normal QRS, normal QT interval. Normal axis.  ____________________________________________  ED COURSE:  Pertinent labs & imaging results that were available during my care of the patient were reviewed by me and considered in my medical decision making (see chart for details).  Clinical Course  Patient is in no acute distress, we will check basic labs, give DuoNeb times and IV Solu-Medrol.  Procedures ____________________________________________   LABS (pertinent positives/negatives)  Labs Reviewed  CBC WITH DIFFERENTIAL/PLATELET - Abnormal; Notable for the following:       Result Value   RDW 15.2 (*)    All other components within normal limits  BASIC  METABOLIC PANEL - Abnormal; Notable for the following:    Glucose, Bld 143 (*)    BUN 29 (*)    Creatinine, Ser 1.21 (*)    GFR calc non Af Amer 41 (*)    GFR calc Af Amer 48 (*)    All other components within normal limits  BRAIN NATRIURETIC PEPTIDE - Abnormal; Notable for the following:    B Natriuretic Peptide 171.0 (*)    All other components within normal limits  TROPONIN I    RADIOLOGY  Chest x-ray IMPRESSION: No active disease.  ____________________________________________  FINAL ASSESSMENT AND PLAN  COPD exacerbation, Hypoxia, Chest pain  Plan: Patient with labs and imaging as dictated above. Patient presented to the ER with chest pain and difficulty breathing. I feel her symptoms are likely mostly COPD related. She will require serial troponins. Upon ambulation here her oxygen saturations are around 86% and she felt very dyspneic. I will discuss with the hospitalist for admission.   Emily Filbert, MD   Note: This dictation was prepared with Dragon dictation. Any transcriptional errors that result from this process are unintentional    Emily Filbert, MD 01/10/16 1421    Emily Filbert, MD 01/10/16 1425

## 2016-01-10 NOTE — ED Notes (Signed)
Admitting MD at bedside.

## 2016-01-10 NOTE — ED Notes (Signed)
Patient ambulated to the bathroom with assistance.  Patient breathing became labored and started wheezing more than at rest.  Patient saturation level dropped to 88% and bumped back up to 91% once back in the bed. MD notified of changes

## 2016-01-10 NOTE — ED Notes (Signed)
Per MD patient placed on 2L nasal cannula to see how she tolerates it.

## 2016-01-10 NOTE — H&P (Signed)
[facility] [place] [facility]   PATIENT NAME: Connie Wilkins    MR#:  [id number]  DATE OF BIRTH:  1933-12-02  DATE OF ADMISSION:  01/10/2016  PRIMARY CARE PHYSICIAN: Leanna Sato, MD   REQUESTING/REFERRING PHYSICIAN: Dr. Daryel November  CHIEF COMPLAINT:   Chief Complaint  Patient presents with  . Chest Pain  . Shortness of Breath    HISTORY OF PRESENT ILLNESS:  Connie Wilkins  is a 82 y.o. female with a known history of COPD, diabetes, previous history of MI, hypertension, hypothyroidism, history of CHF who presents to the hospital due to shortness of breath. Patient is deaf due to history of childhood meningitis and therefore most of the history obtained from the son at bedside using sign language. As per the son patient has been feeling short of breath now for the past 2-3 days progressively getting worse. She is more short of breath and exertion and at rest. No nausea, vomiting, abdominal pain. Positive cough but nonproductive. Patient has been afebrile. She came to the ER due to worsening shortness of breath and chest pain was noted to be in COPD exacerbation and hospitalist services were contacted further treatment and evaluation.  PAST MEDICAL HISTORY:   Past Medical History:  Diagnosis Date  . COPD (chronic obstructive pulmonary disease) (HCC)   . Diabetes mellitus without complication (HCC)   . MI, old     PAST SURGICAL HISTORY:   Past Surgical History:  Procedure Laterality Date  . CORONARY STENT PLACEMENT      SOCIAL HISTORY:   Social History  Substance Use Topics  . Smoking status: Passive Smoke Exposure - Never Smoker  . Smokeless tobacco: Not on file  . Alcohol use Not on file    FAMILY HISTORY:  History reviewed. No pertinent family history.  DRUG ALLERGIES:  No Known Allergies  REVIEW OF SYSTEMS:   Review of Systems  Constitutional: Negative for fever and weight loss.  HENT: Negative for congestion, nosebleeds and  tinnitus.   Eyes: Negative for blurred vision, double vision and redness.  Respiratory: Positive for cough and shortness of breath. Negative for hemoptysis.   Cardiovascular: Positive for chest pain. Negative for orthopnea, leg swelling and PND.  Gastrointestinal: Negative for abdominal pain, diarrhea, melena, nausea and vomiting.  Genitourinary: Negative for dysuria, hematuria and urgency.  Musculoskeletal: Negative for falls and joint pain.  Neurological: Negative for dizziness, tingling, sensory change, focal weakness, seizures, weakness and headaches.  Endo/Heme/Allergies: Negative for polydipsia. Does not bruise/bleed easily.  Psychiatric/Behavioral: Negative for depression and memory loss. The patient is not nervous/anxious.     MEDICATIONS AT HOME:   Prior to Admission medications   Medication Sig Start Date End Date Taking? Authorizing Provider  amLODipine (NORVASC) 5 MG tablet Take 1 tablet by mouth daily. 11/27/15  Yes Historical Provider, MD  aspirin EC 81 MG tablet Take 81 mg by mouth daily.   Yes Historical Provider, MD  atorvastatin (LIPITOR) 80 MG tablet Take 1 tablet by mouth daily at 6 PM. 12/16/15  Yes Historical Provider, MD  clopidogrel (PLAVIX) 75 MG tablet Take 1 tablet by mouth daily. 12/16/15  Yes Historical Provider, MD  furosemide (LASIX) 20 MG tablet Take 1 tablet by mouth daily. 12/25/15  Yes Historical Provider, MD  gabapentin (NEURONTIN) 600 MG tablet Take 1 tablet by mouth at bedtime.  11/06/15  Yes Historical Provider, MD  GLIPIZIDE XL 5 MG 24 hr tablet Take 1 tablet by mouth daily. 12/16/15  Yes Historical Provider,  MD  ibuprofen (ADVIL,MOTRIN) 800 MG tablet Take 1 tablet by mouth every 8 (eight) hours as needed. 01/08/16  Yes Historical Provider, MD  insulin aspart (NOVOLOG) 100 UNIT/ML injection Inject 15 Units into the skin 3 (three) times daily with meals.   Yes Historical Provider, MD  Insulin Detemir (LEVEMIR FLEXPEN) 100 UNIT/ML Pen Inject 40 Units into the  skin daily at 10 pm.   Yes Historical Provider, MD  isosorbide mononitrate (IMDUR) 60 MG 24 hr tablet Take 1 tablet by mouth at bedtime.  12/21/15  Yes Historical Provider, MD  levothyroxine (SYNTHROID, LEVOTHROID) 25 MCG tablet Take 1 tablet by mouth daily. 12/25/15  Yes Historical Provider, MD  lisinopril (PRINIVIL,ZESTRIL) 40 MG tablet Take 1 tablet by mouth daily. 12/25/15  Yes Historical Provider, MD  metoprolol tartrate (LOPRESSOR) 25 MG tablet Take 75 mg by mouth every morning.  01/08/16  Yes Historical Provider, MD      VITAL SIGNS:  Blood pressure (!) 181/83, pulse 71, temperature 98.4 F (36.9 C), temperature source Oral, resp. rate 13, height 5\' 3"  (1.6 m), weight 99.8 kg (220 lb), SpO2 100 %.  PHYSICAL EXAMINATION:  Physical Exam  GENERAL:  82 y.o.-year-old patient lying in the bed in mild resp. Distress.   EYES: Pupils equal, round, reactive to light and accommodation. No scleral icterus. Extraocular muscles intact.  HEENT: Head atraumatic, normocephalic. Oropharynx and nasopharynx clear. No oropharyngeal erythema, moist oral mucosa  NECK:  Supple, no jugular venous distention. No thyroid enlargement, no tenderness.  LUNGS: Prolonged inspiratory and expiratory phase, positive end expiratory wheezing bilaterally, no rales, rhonchi. No use of accessory muscles. CARDIOVASCULAR: S1, S2 RRR. No murmurs, rubs, gallops, clicks.  ABDOMEN: Soft, nontender, nondistended. Bowel sounds present. No organomegaly or mass.  EXTREMITIES: No pedal edema, cyanosis, or clubbing. + 2 pedal & radial pulses b/l.   NEUROLOGIC: Cranial nerves II through XII are intact. No focal Motor or sensory deficits appreciated b/l.  PSYCHIATRIC: The patient is alert and oriented x 3. Good affect.  SKIN: No obvious rash, lesion, or ulcer.   LABORATORY PANEL:   CBC  Recent Labs Lab 01/10/16 1301  WBC 9.4  HGB 12.3  HCT 35.5  PLT 293    ------------------------------------------------------------------------------------------------------------------  Chemistries   Recent Labs Lab 01/10/16 1301  NA 141  K 4.1  CL 106  CO2 27  GLUCOSE 143*  BUN 29*  CREATININE 1.21*  CALCIUM 9.0   ------------------------------------------------------------------------------------------------------------------  Cardiac Enzymes  Recent Labs Lab 01/10/16 1301  TROPONINI <0.03   ------------------------------------------------------------------------------------------------------------------  RADIOLOGY:  Dg Chest Port 1 View  Result Date: 01/10/2016 CLINICAL DATA:  Chest pain and shortness of breath for 2 days EXAM: PORTABLE CHEST 1 VIEW COMPARISON:  04/14/2014 FINDINGS: Cardiac shadow is at the upper limits of normal in size. The lungs are well aerated bilaterally. No focal infiltrate or sizable effusion is seen. No acute bony abnormality is noted. IMPRESSION: No active disease. Electronically Signed   By: Alcide CleverMark  Lukens M.D.   On: 01/10/2016 13:41     IMPRESSION AND PLAN:   82 year old female with past medical history of diabetes, hypertension, hypothyroidism, hyperlipidemia, previous history of MI status post stent, COPD who presents to the hospital due to shortness of breath.  1. COPD exacerbation-this is a cause of patient's shortness of breath. -Treat patient with IV steroids, scheduled DuoNeb's, Pulmicort nebs. -Patient's guardian oxygen at home. She probably needs to be on scheduled inhalers and also needs a nebulizer machine prior to discharge. -Chest x-ray negative for any  acute pneumonia.  2. Accelerated hypertension-continue Lisinopril, metoprolol, Imdur, amlodipine. -Add some as needed hydralazine.  3. Diabetes type 2 without, complication-continue glipizide, Levemir, NovoLog with meals. -Place on carb-controlled diet. Follow blood sugars.  4. Diabetic neuropathy-continue gabapentin  5. History of coronary  artery disease-no acute issue presently. Continue aspirin, Plavix, statin, beta blocker.  6. Hypothyroidism-continue Synthroid.  7. History of CHF-clinically patient was not in congestive heart failure. Continue Lasix, beta blocker, lisinopril.    All the records are reviewed and case discussed with ED provider. Management plans discussed with the patient, family and they are in agreement.  CODE STATUS: Full  TOTAL TIME TAKING CARE OF THIS PATIENT: 45 minutes.    Houston Siren M.D on 01/10/2016 at 3:24 PM  Between 7am to 6pm - Pager - [phone]  After 6pm go to [email or web address] - password [email or web address] [facility]  [facility]  Office  [phone]  CC: Primary care physician; Leanna Sato, MD

## 2016-01-10 NOTE — ED Triage Notes (Signed)
Pt states chest pain and SOB for 2 days, states hx of cardiac stents, mid chest pain, pt is deaf

## 2016-01-11 LAB — CBC
HEMATOCRIT: 35.7 % (ref 35.0–47.0)
HEMOGLOBIN: 12.1 g/dL (ref 12.0–16.0)
MCH: 28.7 pg (ref 26.0–34.0)
MCHC: 33.8 g/dL (ref 32.0–36.0)
MCV: 84.9 fL (ref 80.0–100.0)
Platelets: 277 10*3/uL (ref 150–440)
RBC: 4.21 MIL/uL (ref 3.80–5.20)
RDW: 14.9 % — AB (ref 11.5–14.5)
WBC: 10.7 10*3/uL (ref 3.6–11.0)

## 2016-01-11 LAB — BASIC METABOLIC PANEL
Anion gap: 10 (ref 5–15)
BUN: 36 mg/dL — AB (ref 6–20)
CO2: 23 mmol/L (ref 22–32)
Calcium: 8.8 mg/dL — ABNORMAL LOW (ref 8.9–10.3)
Chloride: 103 mmol/L (ref 101–111)
Creatinine, Ser: 1.38 mg/dL — ABNORMAL HIGH (ref 0.44–1.00)
GFR calc Af Amer: 41 mL/min — ABNORMAL LOW (ref >60)
GFR, EST NON AFRICAN AMERICAN: 35 mL/min — AB (ref >60)
GLUCOSE: 359 mg/dL — AB (ref 65–99)
POTASSIUM: 3.9 mmol/L (ref 3.5–5.1)
Sodium: 136 mmol/L (ref 135–145)

## 2016-01-11 LAB — GLUCOSE, CAPILLARY
Glucose-Capillary: 398 mg/dL — ABNORMAL HIGH (ref 65–99)
Glucose-Capillary: 343 mg/dL — ABNORMAL HIGH (ref 65–99)
Glucose-Capillary: 433 mg/dL — ABNORMAL HIGH (ref 65–99)
Glucose-Capillary: 411 mg/dL — ABNORMAL HIGH (ref 65–99)
Glucose-Capillary: 429 mg/dL — ABNORMAL HIGH (ref 65–99)
Glucose-Capillary: 344 mg/dL — ABNORMAL HIGH (ref 65–99)

## 2016-01-11 MED ORDER — INSULIN ASPART 100 UNIT/ML ~~LOC~~ SOLN
20.0000 [IU] | Freq: Three times a day (TID) | SUBCUTANEOUS | Status: DC
  Administered 2016-01-11 – 2016-01-12 (×4): 20 [IU] via SUBCUTANEOUS
  Filled 2016-01-11 (×4): qty 20

## 2016-01-11 MED ORDER — INSULIN DETEMIR 100 UNIT/ML ~~LOC~~ SOLN
50.0000 [IU] | Freq: Every day | SUBCUTANEOUS | Status: DC
  Administered 2016-01-11: 50 [IU] via SUBCUTANEOUS
  Filled 2016-01-11 (×2): qty 0.5

## 2016-01-11 MED ORDER — PREDNISONE 50 MG PO TABS
50.0000 mg | ORAL_TABLET | Freq: Every day | ORAL | Status: DC
  Administered 2016-01-12: 50 mg via ORAL
  Filled 2016-01-11: qty 1

## 2016-01-11 MED ORDER — GLIPIZIDE ER 5 MG PO TB24
5.0000 mg | ORAL_TABLET | Freq: Two times a day (BID) | ORAL | Status: DC
  Administered 2016-01-11 – 2016-01-12 (×2): 5 mg via ORAL
  Filled 2016-01-11 (×2): qty 1

## 2016-01-11 MED ORDER — METHYLPREDNISOLONE SODIUM SUCC 40 MG IJ SOLR: 40.0000 mg | Freq: Two times a day (BID) | INTRAMUSCULAR | Status: DC

## 2016-01-11 NOTE — Progress Notes (Signed)
CBG 410. Notified MD. Ordered ACHS FS, instructed to give 15 units novolog and 40 units levemir. Will continue to assess.

## 2016-01-11 NOTE — Progress Notes (Signed)
PT Cancellation Note  Patient Details Name: Connie Wilkins MRN: [id number] DOB: September 22, 1933   Cancelled Treatment:    Reason Eval/Treat Not Completed: Other (comment). Consult received and chart reviewed. Scheduled ASL interpreter for 01/12/16. Will plan to perform evaluation next date.   Genora Arp 01/11/2016, 4:03 PM Connie Wilkins, PT, DPT [phone]

## 2016-01-11 NOTE — Progress Notes (Signed)
Inpatient Diabetes Program Recommendations  AACE/ADA: New Consensus Statement on Inpatient Glycemic Control (2015)  Target Ranges:  Prepandial:   less than 140 mg/dL      Peak postprandial:   less than 180 mg/dL (1-2 hours)      Critically ill patients:  140 - 180 mg/dL   Lab Results  Component Value Date   GLUCAP 343 (H) 01/11/2016   HGBA1C 10.1 (H) 04/15/2014    Review of Glycemic Control  Results for [name], Marya L (MRN [id number]) as of 01/11/2016 10:20  Ref. Range 01/10/2016 21:43 01/10/2016 23:58 01/11/2016 01:32 01/11/2016 08:02  Glucose-Capillary Latest Ref Range: 65 - 99 mg/dL 782410 (H) 956407 (H) 213398 (H) 343 (H)    Diabetes history: Type 2 Outpatient Diabetes medications: Glipizide 5mg /day, Novolog 15 units tid, Levemir 40 units qhs Current orders for Inpatient glycemic control:Glipizide 5mg /day, Novolog 15 units tid, Levemir 40 units qhs, Novolog 0-15 units tid, *Solumedrol 40mg  q6h  Inpatient Diabetes Program Recommendations:  Consider increasing mealtime Novolog to 20 units tid, consider increasing Levemir to 50 units qhs and increasing the Novolog correction to resistant scale (0-20 units) tid  Susette RacerJulie Nickolai Rinks, RN, [place], [place]HA, CDE Diabetes Coordinator Inpatient Diabetes Program  [phone] (Team Pager) [phone] [facility] Office) 01/11/2016 10:31 AM

## 2016-01-11 NOTE — Progress Notes (Signed)
CBG 398. Updated MD. No new orders given. Will continue to assess.

## 2016-01-12 LAB — GLUCOSE, CAPILLARY
GLUCOSE-CAPILLARY: 189 mg/dL — AB (ref 65–99)
Glucose-Capillary: 192 mg/dL — ABNORMAL HIGH (ref 65–99)

## 2016-01-12 MED ORDER — AZITHROMYCIN 250 MG PO TABS: ORAL_TABLET | ORAL | 0 refills | Status: DC

## 2016-01-12 MED ORDER — IBUPROFEN 800 MG PO TABS: 800.0000 mg | ORAL_TABLET | Freq: Two times a day (BID) | ORAL | 0 refills | Status: DC | PRN

## 2016-01-12 MED ORDER — PREDNISONE 10 MG PO TABS: ORAL_TABLET | ORAL | 0 refills | Status: DC

## 2016-01-12 MED ORDER — IPRATROPIUM-ALBUTEROL 0.5-2.5 (3) MG/3ML IN SOLN: 3.0000 mL | Freq: Three times a day (TID) | RESPIRATORY_TRACT | 1 refills | Status: DC

## 2016-01-12 MED ORDER — AZITHROMYCIN 250 MG PO TABS: 250.0000 mg | ORAL_TABLET | Freq: Every day | ORAL | Status: DC

## 2016-01-12 MED ORDER — IPRATROPIUM-ALBUTEROL 0.5-2.5 (3) MG/3ML IN SOLN
3.0000 mL | Freq: Three times a day (TID) | RESPIRATORY_TRACT | Status: DC
  Administered 2016-01-12 (×2): 3 mL via RESPIRATORY_TRACT
  Filled 2016-01-12 (×2): qty 3

## 2016-01-12 MED ORDER — AZITHROMYCIN 250 MG PO TABS
500.0000 mg | ORAL_TABLET | Freq: Every day | ORAL | Status: AC
  Administered 2016-01-12: 500 mg via ORAL
  Filled 2016-01-12: qty 2

## 2016-01-12 NOTE — Progress Notes (Signed)
Discharge instructions, follow-up appointments, and prescriptions were provided to the pt. A portable oxygen tank was delivered to the pt. The pt was taken downstairs via wheelchair by NT.

## 2016-01-12 NOTE — Discharge Summary (Signed)
[facility] [place] at [facility]   PATIENT NAME: Connie FischerSandra Wilkins    MR#:  [id number]  DATE OF BIRTH:  December 05, 1933  DATE OF ADMISSION:  01/10/2016 ADMITTING PHYSICIAN: Houston SirenVivek J Sainani, MD  DATE OF DISCHARGE: 01/12/16  PRIMARY CARE PHYSICIAN: Leanna SatoMILES,LINDA M, MD    ADMISSION DIAGNOSIS:  Hypoxia [R09.02] COPD exacerbation (HCC) [J44.1] Chest pain, unspecified chest pain type [R07.9]  DISCHARGE DIAGNOSIS:  Acute on chronic respiratory failure due to COPD flare Acute mild bronchitis HTN Uncontrolled DM-2 likely due to steroids  SECONDARY DIAGNOSIS:   Past Medical History:  Diagnosis Date  . COPD (chronic obstructive pulmonary disease) (HCC)   . Diabetes mellitus without complication (HCC)   . MI, old     HOSPITAL COURSE:   82 year old female with past medical history of diabetes, hypertension, hypothyroidism, hyperlipidemia, previous history of MI status post stent, COPD who presents to the hospital due to shortness of breath.  1. Acute on chronic respiraotry failure due to COPD exacerbation-this is a cause of patient's shortness of breath. -recieved IV steroids, scheduled DuoNeb's, Pulmicort nebs. -Patient's guardian oxygen at home. She probably needs to be on scheduled inhalers and also needs a nebulizer machine prior to discharge. -Chest x-ray negative for any acute pneumonia. -empiric z-pak for bronchitis  2. Accelerated hypertension-continue Lisinopril, metoprolol, Imdur, amlodipine. -Add some as needed hydralazine.  3. Diabetes type 2 without, complication-continue glipizide, Levemir, NovoLog with meals. -Place on carb-controlled diet. Follow blood sugars.  4. Diabetic neuropathy-continue gabapentin  5. History of coronary artery disease-no acute issue presently. Continue aspirin, Plavix, statin, beta blocker.  6. Hypothyroidism-continue Synthroid.  7. History of CHF-clinically patient was not in congestive heart failure. Continue  Lasix, beta blocker, lisinopril.  Overall stable and improving PT to see prior to d/c Assess for home oxygen need during daytime CONSULTS OBTAINED:    DRUG ALLERGIES:  No Known Allergies  DISCHARGE MEDICATIONS:   Current Discharge Medication List    START taking these medications   Details  azithromycin (ZITHROMAX) 250 MG tablet Take as directed Qty: 4 each, Refills: 0    ipratropium-albuterol (DUONEB) 0.5-2.5 (3) MG/3ML SOLN Take 3 mLs by nebulization 3 (three) times daily. Qty: 360 mL, Refills: 1    predniSONE (DELTASONE) 10 MG tablet Take 50 mg daily. Taper by 10 mg daily then stop Qty: 15 tablet, Refills: 0      CONTINUE these medications which have CHANGED   Details  ibuprofen (ADVIL,MOTRIN) 800 MG tablet Take 1 tablet (800 mg total) by mouth 2 (two) times daily as needed. Qty: 30 tablet, Refills: 0      CONTINUE these medications which have NOT CHANGED   Details  amLODipine (NORVASC) 5 MG tablet Take 1 tablet by mouth daily.    aspirin EC 81 MG tablet Take 81 mg by mouth daily.    atorvastatin (LIPITOR) 80 MG tablet Take 1 tablet by mouth daily at 6 PM.    clopidogrel (PLAVIX) 75 MG tablet Take 1 tablet by mouth daily.    furosemide (LASIX) 20 MG tablet Take 1 tablet by mouth daily.    gabapentin (NEURONTIN) 600 MG tablet Take 1 tablet by mouth at bedtime.     GLIPIZIDE XL 5 MG 24 hr tablet Take 1 tablet by mouth daily.    insulin aspart (NOVOLOG) 100 UNIT/ML injection Inject 15 Units into the skin 3 (three) times daily with meals.    Insulin Detemir (LEVEMIR FLEXPEN) 100 UNIT/ML Pen Inject 40 Units into the skin daily  at 10 pm.    isosorbide mononitrate (IMDUR) 60 MG 24 hr tablet Take 1 tablet by mouth at bedtime.     levothyroxine (SYNTHROID, LEVOTHROID) 25 MCG tablet Take 1 tablet by mouth daily.    lisinopril (PRINIVIL,ZESTRIL) 40 MG tablet Take 1 tablet by mouth daily.    metoprolol tartrate (LOPRESSOR) 25 MG tablet Take 75 mg by mouth every  morning.         If you experience worsening of your admission symptoms, develop shortness of breath, life threatening emergency, suicidal or homicidal thoughts you must seek medical attention immediately by calling 911 or calling your MD immediately  if symptoms less severe.  You Must read complete instructions/literature along with all the possible adverse reactions/side effects for all the Medicines you take and that have been prescribed to you. Take any new Medicines after you have completely understood and accept all the possible adverse reactions/side effects.   Please note  You were cared for by a hospitalist during your hospital stay. If you have any questions about your discharge medications or the care you received while you were in the hospital after you are discharged, you can call the unit and asked to speak with the hospitalist on call if the hospitalist that took care of you is not available. Once you are discharged, your primary care physician will handle any further medical issues. Please note that NO REFILLS for any discharge medications will be authorized once you are discharged, as it is imperative that you return to your primary care physician (or establish a relationship with a primary care physician if you do not have one) for your aftercare needs so that they can reassess your need for medications and monitor your lab values. Today   SUBJECTIVE   Feels better. Coughing up yellow phlegm no fever  VITAL SIGNS:  Blood pressure (!) 184/78, pulse 73, temperature 97.4 F (36.3 C), temperature source Oral, resp. rate 20, height 5\' 3"  (1.6 m), weight 99.8 kg (220 lb), SpO2 97 %.  I/O:   Intake/Output Summary (Last 24 hours) at 01/12/16 1027 Last data filed at 01/12/16 0833  Gross per 24 hour  Intake              481 ml  Output             1875 ml  Net            -1394 ml    PHYSICAL EXAMINATION:  GENERAL:  82 y.o.-year-old patient lying in the bed with no acute  distress.  EYES: Pupils equal, round, reactive to light and accommodation. No scleral icterus. Extraocular muscles intact.  HEENT: Head atraumatic, normocephalic. Oropharynx and nasopharynx clear.  NECK:  Supple, no jugular venous distention. No thyroid enlargement, no tenderness.  LUNGS: Normal breath sounds bilaterally, no wheezing, rales,rhonchi or crepitation. No use of accessory muscles of respiration. Scattered wheezing CARDIOVASCULAR: S1, S2 normal. No murmurs, rubs, or gallops.  ABDOMEN: Soft, non-tender, non-distended. Bowel sounds present. No organomegaly or mass.  EXTREMITIES: No pedal edema, cyanosis, or clubbing.  NEUROLOGIC: Cranial nerves II through XII are intact. Muscle strength 5/5 in all extremities. Sensation intact. Gait not checked.  PSYCHIATRIC: The patient is alert and oriented x 3.  SKIN: No obvious rash, lesion, or ulcer.   DATA REVIEW:   CBC   Recent Labs Lab 01/11/16 0543  WBC 10.7  HGB 12.1  HCT 35.7  PLT 277    Chemistries   Recent Labs Lab 01/11/16 0543  NA 136  K 3.9  CL 103  CO2 23  GLUCOSE 359*  BUN 36*  CREATININE 1.38*  CALCIUM 8.8*    Microbiology Results   No results found for this or any previous visit (from the past 240 hour(s)).  RADIOLOGY:  Dg Chest Port 1 View  Result Date: 01/10/2016 CLINICAL DATA:  Chest pain and shortness of breath for 2 days EXAM: PORTABLE CHEST 1 VIEW COMPARISON:  04/14/2014 FINDINGS: Cardiac shadow is at the upper limits of normal in size. The lungs are well aerated bilaterally. No focal infiltrate or sizable effusion is seen. No acute bony abnormality is noted. IMPRESSION: No active disease. Electronically Signed   By: Alcide Clever M.D.   On: 01/10/2016 13:41     Management plans discussed with the patient, family and they are in agreement.  CODE STATUS:     Code Status Orders        Start     Ordered   01/10/16 1718  Full code  Continuous     01/10/16 1717    Code Status History    Date  Active Date Inactive Code Status Order ID Comments User Context   This patient has a current code status but no historical code status.      TOTAL TIME TAKING CARE OF THIS PATIENT: 40 minutes.    Duane Trias M.D on 01/12/2016 at 10:27 AM  Between 7am to 6pm - Pager - [phone] After 6pm go to [email or web address] - password [email or web address] [facility]  [place] [facility]  Office  [phone]  CC: Primary care physician; Leanna Sato, MD

## 2016-01-12 NOTE — Care Management Important Message (Signed)
Important Message  Patient Details  Name: Connie Wilkins MRN: [id number] Date of Birth: 09/19/1933   Medicare Important Message Given:  Yes    Chapman FitchBOWEN, Zohar Laing T, RN 01/12/2016, 11:59 AM

## 2016-01-12 NOTE — Care Management (Signed)
Patient admitted with COPD.  Patient deaf.  History obtained with sign language interpreter.  Patient lives at home with her husband.  Adult son at bedside.  Patient has nocturnal O2 through [employer].  RW at home.  [facility] [place]  Darreld McleanLinda Miles = PCP.    MD has ordered home nebulizer.  Delivered by Barbara CowerJason with [facility].  Home health orders for PT and RN placed.  Agency preference list was provided.  [facility] was selected.   Barbara CowerJason with [facility] was notified of referral.  Patient has qualifying sats for continuous home O2.  I have faxed the order for continuous O2 and qualifying sats to [employer].  Portable tank to be delivered to hospital prior to discharge.  RNCM signing off.

## 2016-01-12 NOTE — Evaluation (Signed)
Physical Therapy Evaluation Patient Details Name: Connie Wilkins MRN: [id number] DOB: 1933-11-15 Today's Date: 01/12/2016   History of Present Illness  Pt is a 82 y.o female admitted to hospital for COPD exacerbation. She was brought to ER with complaints of chest pain and SOB for 2 days. 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 PMH includes T2DM, HTN, CHF, CAD, hx of MI, and hx of cardiac stents  Clinical Impression  Pt is a pleasant and cooperative 82 y/o female who presents with generalized weakness. Sign language interpreter used for evaluation. PLOF: Pt was independent with household ambulation (room to room) with use of RW. Independent with ADLs as well. Pt currently uses home O2 at night only. Pt requires mod assist for bed mobility, +2 mod assist for transfers, and +2 min assist for ambulation (with RW and O2, 67ft). Pt demonstrates step-to gait pattern with increased lateral sway to both R and L. Verbal cueing to keep body close to RW for safety. Requires 1 standing rest break and 1 sitting rest break due to back pain and SOB. Pt able to maintain standing balance with single UE support on RW with min assist from PT (in order to sign to interpreter). One instance of LOB when pt took both hands off RW, mod assist from PT to regain balance. Pt will benefit from skilled PT services in order to improve strength, balance, endurance, functional mobility, and safety with DME use. At this time pt is appropriate for HHPT due to increased caregiver support at home (son and husband are home with pt) and pt is close to her baseline level of function from a mobility standpoint.   Pt sats on RA at rest: 89% Pt sats on 1L of O2 at rest: 98% Pt sats on RA when ambulating: 86% Pt sats on 1L of O2 when ambulating: 86% Pt sats on 2L of O2 when ambulating: 93%   Follow Up Recommendations Home health PT    Equipment Recommendations  None recommended by PT (Pt reports she has RW at home)     Recommendations for Other Services       Precautions / Restrictions Precautions Precautions: Fall Restrictions Weight Bearing Restrictions: No      Mobility  Bed Mobility Overal bed mobility: Needs Assistance Bed Mobility: Supine to Sit     Supine to sit: Mod assist     General bed mobility comments: Verbal cueing to use UE to push through bed, mod assist to scoot to EOB.  Transfers Overall transfer level: Needs assistance Equipment used: Rolling walker (2 wheeled) Transfers: Sit to/from Stand Sit to Stand: Mod assist;+2 physical assistance         General transfer comment: Pt requires +2 mod assist for sit/stand, requires verbal cueing for proper UE placement on RW.   Ambulation/Gait Ambulation/Gait assistance: Min assist;+2 safety/equipment Ambulation Distance (Feet): 35 Feet Assistive device: Rolling walker (2 wheeled) Gait Pattern/deviations: Step-to pattern;Decreased step length - right;Decreased step length - left;Decreased stride length;Shuffle   Gait velocity interpretation: Below normal speed for age/gender General Gait Details: Pt demonstrates a step-to, shuffling gait pattern with RW with increased lateral sway to both R and L. Moderate verbal and tactile cueing to keep RW close to body for safety. Tactile cueing to decerase trunk flexion. Pt requires 1 standing rest break and 1 sitting rest break due to back pain and SOB.  Stairs            Wheelchair Mobility    Modified Rankin (Stroke  Patients Only)       Balance Overall balance assessment: Needs assistance Sitting-balance support: No upper extremity supported;Feet supported Sitting balance-Leahy Scale: Good Sitting balance - Comments: Min guard from PT to maintain sitting balance.   Standing balance support: Bilateral upper extremity supported Standing balance-Leahy Scale: Fair Standing balance comment: Pt requires B UE support on RW to maintain standing balance. At time takes one hand  off RW to sign to interpreter, able to maintain balance with min guard/assist from PT. One instance of LOB, when pt took both hands off RW, mod assist from PT to regain balance.                             Pertinent Vitals/Pain Pain Assessment: Faces Faces Pain Scale: Hurts little more Pain Location: Back Pain Intervention(s): Limited activity within patient's tolerance;Monitored during session    Home Living Family/patient expects to be discharged to:: Private residence Living Arrangements: Spouse/significant other;Children (Son and husband) Available Help at Discharge: Family Type of Home: House Home Access: Stairs to enter Entrance Stairs-Rails: Right;Left (Cannot reach both, son assists using HHA) Entrance Stairs-Number of Steps: 3 Home Layout: One level Home Equipment: Environmental consultantWalker - 2 wheels;Bedside commode;Shower seat;Grab bars - tub/shower Additional Comments: Son is available all the time to take care of patient    Prior Function Level of Independence: Independent with assistive device(s)         Comments: Pt reports she is able to ambulate short distance, room to room, with a RW. there are many chairs throughout the house if she needs to stop and take a break. Independent with ADLs, son is always around and is able to watch her to make sure she does not fall.     Hand Dominance        Extremity/Trunk Assessment   Upper Extremity Assessment: Overall WFL for tasks assessed (UE grossly 4+/5)           Lower Extremity Assessment: Generalized weakness (LE grossly 4-/5)         Communication   Communication: Deaf;Interpreter utilized  Cognition Arousal/Alertness: Awake/alert Behavior During Therapy: WFL for tasks assessed/performed Overall Cognitive Status: Within Functional Limits for tasks assessed                      General Comments      Exercises        Assessment/Plan    PT Assessment Patient needs continued PT services  PT  Diagnosis Generalized weakness;Difficulty walking   PT Problem List Decreased strength;Decreased activity tolerance;Decreased balance;Decreased mobility;Decreased coordination;Decreased knowledge of use of DME;Decreased safety awareness  PT Treatment Interventions DME instruction;Gait training;Stair training;Functional mobility training;Therapeutic activities;Therapeutic exercise;Balance training;Patient/family education   PT Goals (Current goals can be found in the Care Plan section) Acute Rehab PT Goals Patient Stated Goal: To return home PT Goal Formulation: With patient Time For Goal Achievement: 01/26/16 Potential to Achieve Goals: Good    Frequency Min 2X/week   Barriers to discharge        Co-evaluation               End of Session Equipment Utilized During Treatment: Gait belt;Oxygen Activity Tolerance: Patient tolerated treatment well;Patient limited by fatigue Patient left: in chair;with family/visitor present Nurse Communication: Mobility status;Other (comment) (O2 Sats)         Time: [id number] PT Time Calculation (min) (ACUTE ONLY): 30 min   Charges:  PT G Codes:        Thereasa Parkin 2016/01/22, 2:45 PM Thereasa Parkin, SPT [phone]

## 2016-01-12 NOTE — Progress Notes (Signed)
[facility] - [place] at [facility]   PATIENT NAME: Connie Wilkins    MR#:  [id number]  DATE OF BIRTH:  07/23/1933  SUBJECTIVE:  Feels better today. Coughing up yellow phlegm. No fever  REVIEW OF SYSTEMS:   Review of Systems  Constitutional: Negative for chills, fever and weight loss.  HENT: Negative for ear discharge, ear pain and nosebleeds.   Eyes: Negative for blurred vision, pain and discharge.  Respiratory: Positive for cough and sputum production. Negative for shortness of breath, wheezing and stridor.   Cardiovascular: Negative for chest pain, palpitations, orthopnea and PND.  Gastrointestinal: Negative for abdominal pain, diarrhea, nausea and vomiting.  Genitourinary: Negative for frequency and urgency.  Musculoskeletal: Negative for back pain and joint pain.  Neurological: Positive for weakness. Negative for sensory change, speech change and focal weakness.  Psychiatric/Behavioral: Negative for depression and hallucinations. The patient is not nervous/anxious.   All other systems reviewed and are negative.  Tolerating Diet:yes Tolerating PT: pending  DRUG ALLERGIES:  No Known Allergies  VITALS:  Blood pressure (!) 184/78, pulse 73, temperature 97.4 F (36.3 C), temperature source Oral, resp. rate 20, height  (1.6 m), weight 99.8 kg (220 lb), SpO2 97 %.  PHYSICAL EXAMINATION:   Physical Exam  GENERAL:  82 y.o.-year-old patient lying in the bed with no acute distress.  EYES: Pupils equal, round, reactive to light and accommodation. No scleral icterus. Extraocular muscles intact.  HEENT: Head atraumatic, normocephalic. Oropharynx and nasopharynx clear.  NECK:  Supple, no jugular venous distention. No thyroid enlargement, no tenderness.  LUNGS: Normal breath sounds bilaterally, no wheezing, rales, rhonchi. No use of accessory muscles of respiration.  CARDIOVASCULAR: S1, S2 normal. No murmurs, rubs, or gallops.  ABDOMEN: Soft, nontender,  nondistended. Bowel sounds present. No organomegaly or mass.  EXTREMITIES: No cyanosis, clubbing or edema b/l.    NEUROLOGIC: Cranial nerves II through XII are intact. No focal Motor or sensory deficits b/l.   PSYCHIATRIC:  patient is alert and oriented x 3.  SKIN: No obvious rash, lesion, or ulcer.   LABORATORY PANEL:  CBC  Recent Labs Lab 01/11/16 0543  WBC 10.7  HGB 12.1  HCT 35.7  PLT 277    Chemistries   Recent Labs Lab 01/11/16 0543  NA 136  K 3.9  CL 103  CO2 23  GLUCOSE 359*  BUN 36*  CREATININE 1.38*  CALCIUM 8.8*   Cardiac Enzymes  Recent Labs Lab 01/10/16 1301  TROPONINI <0.03   RADIOLOGY:  Dg Chest Port 1 View  Result Date: 01/10/2016 CLINICAL DATA:  Chest pain and shortness of breath for 2 days EXAM: PORTABLE CHEST 1 VIEW COMPARISON:  04/14/2014 FINDINGS: Cardiac shadow is at the upper limits of normal in size. The lungs are well aerated bilaterally. No focal infiltrate or sizable effusion is seen. No acute bony abnormality is noted. IMPRESSION: No active disease. Electronically Signed   By: Alcide Clever M.D.   On: 01/10/2016 13:41   ASSESSMENT AND PLAN:   82 year old female with past medical history of diabetes, hypertension, hypothyroidism, hyperlipidemia, previous history of MI status post stent, COPD who presents to the hospital due to shortness of breath.  1. COPD exacerbation-this is a cause of patient's shortness of breath. -Treat patient with IV steroids, scheduled DuoNeb's, Pulmicort nebs. -Patient uses oxygen at home during night. She probably needs to be on scheduled inhalers and also needs a nebulizer machine prior to discharge. -Chest x-ray negative for any acute pneumonia.  2.  Accelerated hypertension-continue Lisinopril, metoprolol, Imdur, amlodipine. -Add some as needed hydralazine.  3. Diabetes type 2 without, complication-continue glipizide, Levemir, NovoLog with meals. -Place on carb-controlled diet. Follow blood  sugars.  4. Diabetic neuropathy-continue gabapentin  5. History of coronary artery disease-no acute issue presently. Continue aspirin, Plavix, statin, beta blocker.  6. Hypothyroidism-continue Synthroid.  7. History of CHF-clinically patient was not in congestive heart failure. Continue Lasix, beta blocker, lisinopril.  Case discussed with Care Management/Social Worker. Management plans discussed with the patient, family and they are in agreement.  CODE STATUS: FULL  DVT Prophylaxis:Lovenox  TOTAL TIME TAKING CARE OF THIS PATIENT: 30 minutes.  >50% time spent on counselling and coordination of care with son  POSSIBLE D/C IN *1-2 DAYS, DEPENDING ON CLINICAL CONDITION.  Note: This dictation was prepared with Dragon dictation along with smaller phrase technology. Any transcriptional errors that result from this process are unintentional.  Keiondra Brookover M.D   Between 7am to 6pm - Pager - [phone]  After 6pm go to [email or web address] - password [email or web address] [email or web address]  [place] [facility]  Office  [phone]  CC: Primary care physician; Leanna SatoMILES,LINDA M, MD

## 2016-01-12 NOTE — Progress Notes (Addendum)
SATURATION QUALIFICATIONS: (This note is used to comply with regulatory documentation for home oxygen)  Patient Saturations on Room Air at Rest = 89%  Patient Saturations on Room Air while Ambulating 86%  Patient Saturations on 2L of O2 while Ambulating 93%   Please briefly explain why patient needs home oxygen:

## 2016-01-12 NOTE — Discharge Instructions (Signed)
Use your nebulizer as instructed USe your oxygen as before

## 2016-10-15 ENCOUNTER — Encounter: Payer: Self-pay | Admitting: Emergency Medicine

## 2016-10-15 ENCOUNTER — Emergency Department: Payer: Medicare HMO

## 2016-10-15 ENCOUNTER — Observation Stay
Admission: EM | Admit: 2016-10-15 | Discharge: 2016-10-17 | Disposition: A | Discharge status: Home / Self Care | Payer: Medicare HMO | Attending: Internal Medicine | Admitting: Internal Medicine

## 2016-10-15 DIAGNOSIS — M25512 Pain in left shoulder: Secondary | ICD-10-CM | POA: Insufficient documentation

## 2016-10-15 DIAGNOSIS — N183 Chronic kidney disease, stage 3 unspecified: Secondary | ICD-10-CM

## 2016-10-15 DIAGNOSIS — I129 Hypertensive chronic kidney disease with stage 1 through stage 4 chronic kidney disease, or unspecified chronic kidney disease: Secondary | ICD-10-CM | POA: Insufficient documentation

## 2016-10-15 DIAGNOSIS — R52 Pain, unspecified: Secondary | ICD-10-CM

## 2016-10-15 DIAGNOSIS — R0789 Other chest pain: Secondary | ICD-10-CM | POA: Diagnosis not present

## 2016-10-15 DIAGNOSIS — Z7722 Contact with and (suspected) exposure to environmental tobacco smoke (acute) (chronic): Secondary | ICD-10-CM | POA: Insufficient documentation

## 2016-10-15 DIAGNOSIS — N179 Acute kidney failure, unspecified: Secondary | ICD-10-CM | POA: Insufficient documentation

## 2016-10-15 DIAGNOSIS — E1122 Type 2 diabetes mellitus with diabetic chronic kidney disease: Secondary | ICD-10-CM | POA: Diagnosis not present

## 2016-10-15 DIAGNOSIS — Z79899 Other long term (current) drug therapy: Secondary | ICD-10-CM | POA: Insufficient documentation

## 2016-10-15 DIAGNOSIS — Z6841 Body Mass Index (BMI) 40.0 and over, adult: Secondary | ICD-10-CM | POA: Diagnosis not present

## 2016-10-15 DIAGNOSIS — Z794 Long term (current) use of insulin: Secondary | ICD-10-CM | POA: Diagnosis not present

## 2016-10-15 DIAGNOSIS — R079 Chest pain, unspecified: Secondary | ICD-10-CM | POA: Diagnosis present

## 2016-10-15 DIAGNOSIS — E669 Obesity, unspecified: Secondary | ICD-10-CM | POA: Insufficient documentation

## 2016-10-15 DIAGNOSIS — E782 Mixed hyperlipidemia: Secondary | ICD-10-CM | POA: Insufficient documentation

## 2016-10-15 DIAGNOSIS — I2510 Atherosclerotic heart disease of native coronary artery without angina pectoris: Secondary | ICD-10-CM | POA: Diagnosis not present

## 2016-10-15 DIAGNOSIS — R0600 Dyspnea, unspecified: Secondary | ICD-10-CM

## 2016-10-15 DIAGNOSIS — Z7951 Long term (current) use of inhaled steroids: Secondary | ICD-10-CM | POA: Diagnosis not present

## 2016-10-15 DIAGNOSIS — I252 Old myocardial infarction: Secondary | ICD-10-CM | POA: Insufficient documentation

## 2016-10-15 DIAGNOSIS — Z7902 Long term (current) use of antithrombotics/antiplatelets: Secondary | ICD-10-CM | POA: Diagnosis not present

## 2016-10-15 DIAGNOSIS — W19XXXA Unspecified fall, initial encounter: Secondary | ICD-10-CM | POA: Insufficient documentation

## 2016-10-15 DIAGNOSIS — Z7982 Long term (current) use of aspirin: Secondary | ICD-10-CM | POA: Diagnosis not present

## 2016-10-15 DIAGNOSIS — I10 Essential (primary) hypertension: Secondary | ICD-10-CM

## 2016-10-15 DIAGNOSIS — E1169 Type 2 diabetes mellitus with other specified complication: Secondary | ICD-10-CM

## 2016-10-15 DIAGNOSIS — E039 Hypothyroidism, unspecified: Secondary | ICD-10-CM | POA: Diagnosis not present

## 2016-10-15 DIAGNOSIS — M79622 Pain in left upper arm: Secondary | ICD-10-CM | POA: Diagnosis not present

## 2016-10-15 DIAGNOSIS — J441 Chronic obstructive pulmonary disease with (acute) exacerbation: Secondary | ICD-10-CM | POA: Diagnosis not present

## 2016-10-15 DIAGNOSIS — Z955 Presence of coronary angioplasty implant and graft: Secondary | ICD-10-CM | POA: Diagnosis not present

## 2016-10-15 LAB — BASIC METABOLIC PANEL
ANION GAP: 8 (ref 5–15)
BUN: 32 mg/dL — AB (ref 6–20)
CALCIUM: 9.3 mg/dL (ref 8.9–10.3)
CO2: 29 mmol/L (ref 22–32)
Chloride: 104 mmol/L (ref 101–111)
Creatinine, Ser: 1.43 mg/dL — ABNORMAL HIGH (ref 0.44–1.00)
GFR calc Af Amer: 39 mL/min — ABNORMAL LOW (ref >60)
GFR, EST NON AFRICAN AMERICAN: 34 mL/min — AB (ref >60)
Glucose, Bld: 184 mg/dL — ABNORMAL HIGH (ref 65–99)
Potassium: 3.5 mmol/L (ref 3.5–5.1)
SODIUM: 141 mmol/L (ref 135–145)

## 2016-10-15 LAB — CBC
HCT: 36.7 % (ref 35.0–47.0)
HEMOGLOBIN: 12.1 g/dL (ref 12.0–16.0)
MCH: 26.6 pg (ref 26.0–34.0)
MCHC: 32.9 g/dL (ref 32.0–36.0)
MCV: 80.8 fL (ref 80.0–100.0)
Platelets: 275 10*3/uL (ref 150–440)
RBC: 4.54 MIL/uL (ref 3.80–5.20)
RDW: 15.6 % — AB (ref 11.5–14.5)
WBC: 6.6 10*3/uL (ref 3.6–11.0)

## 2016-10-15 LAB — GLUCOSE, CAPILLARY: Glucose-Capillary: 189 mg/dL — ABNORMAL HIGH (ref 65–99)

## 2016-10-15 LAB — TROPONIN I

## 2016-10-15 MED ORDER — IPRATROPIUM-ALBUTEROL 0.5-2.5 (3) MG/3ML IN SOLN: 3.0000 mL | Freq: Three times a day (TID) | RESPIRATORY_TRACT | Status: DC | PRN

## 2016-10-15 MED ORDER — ACETAMINOPHEN 325 MG PO TABS
650.0000 mg | ORAL_TABLET | Freq: Four times a day (QID) | ORAL | Status: DC | PRN
  Administered 2016-10-17: 650 mg via ORAL
  Filled 2016-10-15: qty 2

## 2016-10-15 MED ORDER — LISINOPRIL 20 MG PO TABS
40.0000 mg | ORAL_TABLET | Freq: Every day | ORAL | Status: DC
  Administered 2016-10-16 – 2016-10-17 (×2): 40 mg via ORAL
  Filled 2016-10-15 (×2): qty 2

## 2016-10-15 MED ORDER — HEPARIN SODIUM (PORCINE) 5000 UNIT/ML IJ SOLN
5000.0000 [IU] | Freq: Three times a day (TID) | INTRAMUSCULAR | Status: DC
Start: 2016-10-15 — End: 2016-10-17
  Administered 2016-10-16 (×4): 5000 [IU] via SUBCUTANEOUS
  Filled 2016-10-15 (×4): qty 1

## 2016-10-15 MED ORDER — LEVOTHYROXINE SODIUM 25 MCG PO TABS
25.0000 ug | ORAL_TABLET | Freq: Every day | ORAL | Status: DC
  Administered 2016-10-16 – 2016-10-17 (×2): 25 ug via ORAL
  Filled 2016-10-15 (×2): qty 1

## 2016-10-15 MED ORDER — DOCUSATE SODIUM 100 MG PO CAPS
100.0000 mg | ORAL_CAPSULE | Freq: Two times a day (BID) | ORAL | Status: DC
  Administered 2016-10-16 – 2016-10-17 (×4): 100 mg via ORAL
  Filled 2016-10-15 (×4): qty 1

## 2016-10-15 MED ORDER — FUROSEMIDE 20 MG PO TABS
20.0000 mg | ORAL_TABLET | Freq: Every day | ORAL | Status: DC
  Administered 2016-10-16 – 2016-10-17 (×2): 20 mg via ORAL
  Filled 2016-10-15 (×2): qty 1

## 2016-10-15 MED ORDER — ASPIRIN EC 81 MG PO TBEC
81.0000 mg | DELAYED_RELEASE_TABLET | Freq: Every day | ORAL | Status: DC
  Administered 2016-10-16 – 2016-10-17 (×2): 81 mg via ORAL
  Filled 2016-10-15 (×2): qty 1

## 2016-10-15 MED ORDER — ONDANSETRON HCL 4 MG PO TABS: 4.0000 mg | ORAL_TABLET | Freq: Four times a day (QID) | ORAL | Status: DC | PRN

## 2016-10-15 MED ORDER — AMLODIPINE BESYLATE 5 MG PO TABS
5.0000 mg | ORAL_TABLET | Freq: Every day | ORAL | Status: DC
  Administered 2016-10-16: 5 mg via ORAL
  Filled 2016-10-15: qty 1

## 2016-10-15 MED ORDER — ATORVASTATIN CALCIUM 20 MG PO TABS
80.0000 mg | ORAL_TABLET | Freq: Every day | ORAL | Status: DC
  Administered 2016-10-16 (×2): 80 mg via ORAL
  Filled 2016-10-15 (×2): qty 4

## 2016-10-15 MED ORDER — NITROGLYCERIN 0.4 MG SL SUBL
0.4000 mg | SUBLINGUAL_TABLET | SUBLINGUAL | Status: DC | PRN
  Administered 2016-10-15 (×2): 0.4 mg via SUBLINGUAL
  Filled 2016-10-15 (×2): qty 1

## 2016-10-15 MED ORDER — ISOSORBIDE MONONITRATE ER 60 MG PO TB24
60.0000 mg | ORAL_TABLET | Freq: Every day | ORAL | Status: DC
  Administered 2016-10-16 (×2): 60 mg via ORAL
  Filled 2016-10-15 (×2): qty 1

## 2016-10-15 MED ORDER — METOPROLOL TARTRATE 25 MG PO TABS
75.0000 mg | ORAL_TABLET | ORAL | Status: DC
  Administered 2016-10-16 – 2016-10-17 (×2): 75 mg via ORAL
  Filled 2016-10-15 (×2): qty 3

## 2016-10-15 MED ORDER — ACETAMINOPHEN 650 MG RE SUPP: 650.0000 mg | Freq: Four times a day (QID) | RECTAL | Status: DC | PRN

## 2016-10-15 MED ORDER — INSULIN DETEMIR 100 UNIT/ML ~~LOC~~ SOLN
24.0000 [IU] | Freq: Every day | SUBCUTANEOUS | Status: DC
  Administered 2016-10-16 (×2): 24 [IU] via SUBCUTANEOUS
  Filled 2016-10-15 (×3): qty 0.24

## 2016-10-15 MED ORDER — ONDANSETRON HCL 4 MG/2ML IJ SOLN: 4.0000 mg | Freq: Four times a day (QID) | INTRAMUSCULAR | Status: DC | PRN

## 2016-10-15 MED ORDER — CLOPIDOGREL BISULFATE 75 MG PO TABS
75.0000 mg | ORAL_TABLET | Freq: Every day | ORAL | Status: DC
  Administered 2016-10-16 – 2016-10-17 (×2): 75 mg via ORAL
  Filled 2016-10-15 (×2): qty 1

## 2016-10-15 MED ORDER — INSULIN ASPART 100 UNIT/ML ~~LOC~~ SOLN
0.0000 [IU] | Freq: Three times a day (TID) | SUBCUTANEOUS | Status: DC
  Administered 2016-10-16: 7 [IU] via SUBCUTANEOUS
  Administered 2016-10-16: 4 [IU] via SUBCUTANEOUS
  Filled 2016-10-15: qty 4
  Filled 2016-10-15: qty 7

## 2016-10-15 MED ORDER — GABAPENTIN 300 MG PO CAPS
600.0000 mg | ORAL_CAPSULE | Freq: Every day | ORAL | Status: DC
  Administered 2016-10-16 (×2): 600 mg via ORAL
  Filled 2016-10-15 (×2): qty 2

## 2016-10-15 MED ORDER — ASPIRIN 81 MG PO CHEW
324.0000 mg | CHEWABLE_TABLET | Freq: Once | ORAL | Status: AC
  Administered 2016-10-15: 324 mg via ORAL
  Filled 2016-10-15: qty 4

## 2016-10-15 MED ORDER — SODIUM CHLORIDE 0.9 % IV SOLN
INTRAVENOUS | Status: DC
  Administered 2016-10-16 (×2): via INTRAVENOUS

## 2016-10-15 NOTE — ED Triage Notes (Signed)
Pt c/o left sided chest pain since Sunday.  Pain radiates to left arm.  Denies SHOB, nausea or vomiting.  Pain has been intermittent.  Pt is deaf. Pt describes pain as squeezing feeling .  Respiratory status unlabored.  Skin warm and dry   Peter sign language interpretor on Graceystratus used

## 2016-10-15 NOTE — ED Notes (Signed)
ED Provider at bedside. 

## 2016-10-15 NOTE — ED Notes (Signed)
Per family pt has hard time concepting pain scale 0-10, pt uses faces but states unable to understand

## 2016-10-15 NOTE — ED Notes (Signed)
Per family pt is on o2 2L chronically for COPD, pt 89% on RA, pt placed on 2L

## 2016-10-15 NOTE — ED Provider Notes (Signed)
[facility] Emergency Department Provider Note  ____________________________________________   First MD Initiated Contact with Patient 10/15/16 1730     (approximate)  I have reviewed the triage vital signs and the nursing notes.   HISTORY  Chief Complaint Chest Pain   HPI Connie Wilkins is a 83 y.o. female with a history of coronary artery disease on aspirin and Plavix was presenting to the emergency department with left-sided chest pain. Says that the pain is been off all day and is to the left side of the chest. Says it is a squeezing pain that radiates down her left arm. She says the pain is an 8 out of 10. Associated with some shortness of breath. Does not report nausea and vomiting. Was scheduled to have an outpatient stress test last week by her cardiologist, Dr. Gwen Pounds, which she missed. She says that she is taking her Plavix today but not her afternoon aspirin. Does not take nitroglycerin. Does take isosorbide.   Past Medical History:  Diagnosis Date  . COPD (chronic obstructive pulmonary disease) (HCC)   . Diabetes mellitus without complication (HCC)   . MI, old     Patient Active Problem List   Diagnosis Date Noted  . COPD exacerbation (HCC) 01/10/2016    Past Surgical History:  Procedure Laterality Date  . CORONARY STENT PLACEMENT      Prior to Admission medications   Medication Sig Start Date End Date Taking? Authorizing Provider  amLODipine (NORVASC) 5 MG tablet Take 1 tablet by mouth daily. 11/27/15   [provider]  aspirin EC 81 MG tablet Take 81 mg by mouth daily.    [provider]  atorvastatin (LIPITOR) 80 MG tablet Take 1 tablet by mouth daily at 6 PM. 12/16/15   [provider]  azithromycin (ZITHROMAX) 250 MG tablet Take as directed 01/13/16   Enedina Finner, MD  clopidogrel (PLAVIX) 75 MG tablet Take 1 tablet by mouth daily. 12/16/15   [provider]  furosemide (LASIX) 20 MG tablet Take  1 tablet by mouth daily. 12/25/15   [provider]  gabapentin (NEURONTIN) 600 MG tablet Take 1 tablet by mouth at bedtime.  11/06/15   [provider]  GLIPIZIDE XL 5 MG 24 hr tablet Take 1 tablet by mouth daily. 12/16/15   [provider]  ibuprofen (ADVIL,MOTRIN) 800 MG tablet Take 1 tablet (800 mg total) by mouth 2 (two) times daily as needed. 01/12/16   Enedina Finner, MD  insulin aspart (NOVOLOG) 100 UNIT/ML injection Inject 15 Units into the skin 3 (three) times daily with meals.    [provider]  Insulin Detemir (LEVEMIR FLEXPEN) 100 UNIT/ML Pen Inject 40 Units into the skin daily at 10 pm.    [provider]  ipratropium-albuterol (DUONEB) 0.5-2.5 (3) MG/3ML SOLN Take 3 mLs by nebulization 3 (three) times daily. 01/12/16   Enedina Finner, MD  isosorbide mononitrate (IMDUR) 60 MG 24 hr tablet Take 1 tablet by mouth at bedtime.  12/21/15   [provider]  levothyroxine (SYNTHROID, LEVOTHROID) 25 MCG tablet Take 1 tablet by mouth daily. 12/25/15   [provider]  lisinopril (PRINIVIL,ZESTRIL) 40 MG tablet Take 1 tablet by mouth daily. 12/25/15   [provider]  metoprolol tartrate (LOPRESSOR) 25 MG tablet Take 75 mg by mouth every morning.  01/08/16   [provider]  predniSONE (DELTASONE) 10 MG tablet Take 50 mg daily. Taper by 10 mg daily then stop 01/12/16   Allena Katz,  Jearl Klinefelter, MD    Allergies Patient has no known allergies.  Family History  Problem Relation Age of Onset  . Breast cancer Mother   . COPD Father     Social History Social History  Substance Use Topics  . Smoking status: Passive Smoke Exposure - Never Smoker  . Smokeless tobacco: Never Used  . Alcohol use No    Review of Systems  Constitutional: No fever/chills Eyes: No visual changes. ENT: No sore throat. Cardiovascular: as above Respiratory: as above Gastrointestinal: No abdominal pain.  No nausea, no vomiting.  No diarrhea.  No  constipation. Genitourinary: Negative for dysuria. Musculoskeletal: Negative for back pain. Skin: Negative for rash. Neurological: Negative for headaches, focal weakness or numbness.   ____________________________________________   PHYSICAL EXAM:  VITAL SIGNS: ED Triage Vitals  Enc Vitals Group     BP 10/15/16 1735 (!) 159/95     Pulse Rate 10/15/16 1736 76     Resp 10/15/16 1736 20     Temp --      Temp src --      SpO2 10/15/16 1736 94 %     Weight 10/15/16 1511 220 lb (99.8 kg)     Height 10/15/16 1511 5\' 3"  (1.6 m)     Head Circumference --      Peak Flow --      Pain Score 10/15/16 1511 8     Pain Loc --      Pain Edu? --      Excl. in GC? --     Constitutional: Alert and oriented. Well appearing and in no acute distress. Eyes: Conjunctivae are normal.  Head: Atraumatic. Nose: No congestion/rhinnorhea. Mouth/Throat: Mucous membranes are moist.   Neck: No stridor.   Cardiovascular: Normal rate, regular rhythm. Grossly normal heart sounds.  Good peripheral circulation With equal and intact bilateral radial pulses. Respiratory: Normal respiratory effort.  No retractions. Lungs CTAB. Gastrointestinal: Soft and nontender. No distention.  Musculoskeletal: No lower extremity nor edema.  No joint effusions.  Patient with 5 out of 5 strength to bilateral lower 70s. Ecchymosis to the left lateral, proximal femur. Says that she fell last week but has been able to walk at her baseline with her walker. No deformity. Only mild to moderate tenderness to palpation.  Neurologic:  Normal speech and language. No gross focal neurologic deficits are appreciated.  Skin:  Skin is warm, dry and intact. No rash noted. Psychiatric: Mood and affect are normal. Speech and behavior are normal.  ____________________________________________   LABS (all labs ordered are listed, but only abnormal results are displayed)  Labs Reviewed  BASIC METABOLIC PANEL - Abnormal; Notable for the  following:       Result Value   Glucose, Bld 184 (*)    BUN 32 (*)    Creatinine, Ser 1.43 (*)    GFR calc non Af Amer 34 (*)    GFR calc Af Amer 39 (*)    All other components within normal limits  CBC - Abnormal; Notable for the following:    RDW 15.6 (*)    All other components within normal limits  TROPONIN I   ____________________________________________  EKG  ED ECG REPORT I, Jenesis Martin,  Teena Irani, the attending physician, personally viewed and interpreted this ECG.   Date: 10/15/2016  EKG Time: 1506  Rate: 68  Rhythm: normal sinus rhythm  Axis: Normal  Intervals:none  ST&T Change: No ST segment elevation or depression. No abnormal T-wave inversion.  ____________________________________________  RADIOLOGY  No acute abnormality. ____________________________________________   PROCEDURES  Procedure(s) performed:   Procedures  Critical Care performed:   ____________________________________________   INITIAL IMPRESSION / ASSESSMENT AND PLAN / ED COURSE  Pertinent labs & imaging results that were available during my care of the patient were reviewed by me and considered in my medical decision making (see chart for details).   Clinical Course as of Oct 16 1819  Tue Oct 15, 2016  1731 DG Chest 2 View [DS]    Clinical Course User Index [DS] Myrna BlazerSchaevitz, Sims Laday Matthew, MD   ----------------------------------------- 6:26 PM on 10/15/2016 -----------------------------------------  Asian to receive aspirin as well as nitroglycerin. Patient noncompliant with outpatient workup. Now with worsening chest pain symptoms. We'll admit to the hospital for observation. Patient as well as son understand the plan and are willing to why. Furthermore, the patient is deaf. Some was able to interpret. Signed out to Dr. Sheryle Haildiamond.  ____________________________________________   FINAL CLINICAL IMPRESSION(S) / ED DIAGNOSES  Final diagnoses:  Chest pain      NEW  MEDICATIONS STARTED DURING THIS VISIT:  New Prescriptions   No medications on file     Note:  This document was prepared using Dragon voice recognition software and may include unintentional dictation errors.    Myrna BlazerSchaevitz, Hasnain Manheim Matthew, MD 10/15/16 [phone]

## 2016-10-15 NOTE — H&P (Signed)
Connie Wilkins is an 83 y.o. female.   Chief Complaint: Chest pain HPI: The patient with past medical history of diabetes as well as coronary artery disease status post MI presents emergency department complaining of chest pain. The patient states the pain occurs when she moves read is more common while she is laying down as well as after she eats. She's had this pain before and had been scheduled for a cardiac stress test recently but she missed her appointment. Today she returns with ongoing intermittent central chest pain that radiates down her left arm. She denies nausea, vomiting or diaphoresis. This episode of pain began yesterday and is not relieved by anything she has tried. Due to her ongoing chest pain the emergency department staff called the hospitalist service for admission.  Past Medical History:  Diagnosis Date  . COPD (chronic obstructive pulmonary disease) ([place])   . Diabetes mellitus without complication ([place])   . MI, old     Past Surgical History:  Procedure Laterality Date  . CORONARY STENT PLACEMENT      Family History  Problem Relation Age of Onset  . Breast cancer Mother   . COPD Father    Social History:  reports that she is a non-smoker but has been exposed to tobacco smoke. She has never used smokeless tobacco. She reports that she does not drink alcohol or use drugs.  Allergies: No Known Allergies  Medications Prior to Admission  Medication Sig Dispense Refill  . amLODipine (NORVASC) 5 MG tablet Take 1 tablet by mouth daily.    Marland Kitchen aspirin EC 81 MG tablet Take 81 mg by mouth daily.    Marland Kitchen atorvastatin (LIPITOR) 80 MG tablet Take 1 tablet by mouth daily at 6 PM.    . clopidogrel (PLAVIX) 75 MG tablet Take 1 tablet by mouth daily.    . furosemide (LASIX) 20 MG tablet Take 1 tablet by mouth daily.    Marland Kitchen gabapentin (NEURONTIN) 600 MG tablet Take 1 tablet by mouth at bedtime.     Marland Kitchen GLIPIZIDE XL 5 MG 24 hr tablet Take 1 tablet by mouth daily.    Marland Kitchen ibuprofen  (ADVIL,MOTRIN) 800 MG tablet Take 1 tablet (800 mg total) by mouth 2 (two) times daily as needed. 30 tablet 0  . insulin aspart (NOVOLOG) 100 UNIT/ML injection Inject 15 Units into the skin 2 (two) times daily with a meal. Takes with Lunch and Dinner    . Insulin Detemir (LEVEMIR FLEXPEN) 100 UNIT/ML Pen Inject 40 Units into the skin daily at 10 pm.    . ipratropium-albuterol (DUONEB) 0.5-2.5 (3) MG/3ML SOLN Take 3 mLs by nebulization 3 (three) times daily. 360 mL 1  . isosorbide mononitrate (IMDUR) 60 MG 24 hr tablet Take 1 tablet by mouth at bedtime.     Marland Kitchen levothyroxine (SYNTHROID, LEVOTHROID) 25 MCG tablet Take 1 tablet by mouth daily.    Marland Kitchen lisinopril (PRINIVIL,ZESTRIL) 40 MG tablet Take 1 tablet by mouth daily.    . metoprolol tartrate (LOPRESSOR) 25 MG tablet Take 75 mg by mouth every morning.     Marland Kitchen azithromycin (ZITHROMAX) 250 MG tablet Take as directed (Patient not taking: Reported on 10/15/2016) 4 each 0  . predniSONE (DELTASONE) 10 MG tablet Take 50 mg daily. Taper by 10 mg daily then stop (Patient not taking: Reported on 10/15/2016) 15 tablet 0    Results for orders placed or performed during the hospital encounter of 10/15/16 (from the past 48 hour(s))  Basic metabolic panel  Status: Abnormal   Collection Time: 10/15/16  3:13 PM  Result Value Ref Range   Sodium 141 135 - 145 mmol/L   Potassium 3.5 3.5 - 5.1 mmol/L   Chloride 104 101 - 111 mmol/L   CO2 29 22 - 32 mmol/L   Glucose, Bld 184 (H) 65 - 99 mg/dL   BUN 32 (H) 6 - 20 mg/dL   Creatinine, Ser 1.43 (H) 0.44 - 1.00 mg/dL   Calcium 9.3 8.9 - 10.3 mg/dL   GFR calc non Af Amer 34 (L) >60 mL/min   GFR calc Af Amer 39 (L) >60 mL/min    Comment: (NOTE) The eGFR has been calculated using the CKD EPI equation. This calculation has not been validated in all clinical situations. eGFR's persistently <60 mL/min signify possible Chronic Kidney Disease.    Anion gap 8 5 - 15  CBC     Status: Abnormal   Collection Time: 10/15/16   3:13 PM  Result Value Ref Range   WBC 6.6 3.6 - 11.0 K/uL   RBC 4.54 3.80 - 5.20 MIL/uL   Hemoglobin 12.1 12.0 - 16.0 g/dL   HCT 36.7 35.0 - 47.0 %   MCV 80.8 80.0 - 100.0 fL   MCH 26.6 26.0 - 34.0 pg   MCHC 32.9 32.0 - 36.0 g/dL   RDW 15.6 (H) 11.5 - 14.5 %   Platelets 275 150 - 440 K/uL  Troponin I     Status: None   Collection Time: 10/15/16  3:13 PM  Result Value Ref Range   Troponin I <0.03 <0.03 ng/mL  Glucose, capillary     Status: Abnormal   Collection Time: 10/15/16 10:46 PM  Result Value Ref Range   Glucose-Capillary 189 (H) 65 - 99 mg/dL   Comment 1 Notify RN    Dg Chest 1 View  Result Date: 10/15/2016 CLINICAL DATA:  Left-sided chest pain since Sunday EXAM: CHEST 1 VIEW COMPARISON:  01/10/2016 FINDINGS: Borderline cardiomegaly. Aortic atherosclerosis at the arch without aneurysmal dilatation. No pneumonic consolidation or overt pulmonary edema. No pneumothorax nor effusion. The visualized skeletal structures are unremarkable. IMPRESSION: No active disease. Electronically Signed   By: Ashley Royalty M.D.   On: 10/15/2016 17:46    Review of Systems  Constitutional: Negative for chills and fever.  HENT: Negative for sore throat and tinnitus.   Eyes: Negative for blurred vision and redness.  Respiratory: Negative for cough and shortness of breath.   Cardiovascular: Negative for chest pain, palpitations, orthopnea and PND.  Gastrointestinal: Negative for abdominal pain, diarrhea, nausea and vomiting.  Genitourinary: Negative for dysuria, frequency and urgency.  Musculoskeletal: Negative for joint pain and myalgias.  Skin: Negative for rash.       No lesions  Neurological: Negative for speech change, focal weakness and weakness.  Endo/Heme/Allergies: Does not bruise/bleed easily.       No temperature intolerance  Psychiatric/Behavioral: Negative for depression and suicidal ideas.    Blood pressure (!) 183/67, pulse 64, temperature 97.5 F (36.4 C), temperature source  Oral, resp. rate 20, height _0  (1.6 m), weight 106.9 kg (235 lb 11.2 oz), SpO2 97 %. Physical Exam  Vitals reviewed. Constitutional: She is oriented to person, place, and time. She appears well-developed and well-nourished. No distress.  HENT:  Head: Normocephalic and atraumatic.  Mouth/Throat: Oropharynx is clear and moist.  Eyes: Conjunctivae and EOM are normal. Pupils are equal, round, and reactive to light. No scleral icterus.  Neck: Normal range of motion. Neck supple. No  JVD present. No tracheal deviation present. No thyromegaly present.  Cardiovascular: Normal rate, regular rhythm and normal heart sounds.  Exam reveals no gallop and no friction rub.   No murmur heard. Respiratory: Effort normal and breath sounds normal.  GI: Soft. Bowel sounds are normal. She exhibits no distension. There is no tenderness.  Genitourinary:  Genitourinary Comments: Deferred  Musculoskeletal: Normal range of motion. She exhibits edema.  Lymphadenopathy:    She has no cervical adenopathy.  Neurological: She is alert and oriented to person, place, and time. No cranial nerve deficit. She exhibits normal muscle tone.  Skin: Skin is warm and dry. No rash noted. No erythema.  Psychiatric: She has a normal mood and affect. Her behavior is normal. Judgment and thought content normal.     Assessment/Plan This is an 83 year old female admitted for chest pain. 1. Chest pain: Atypical; troponin is negative. EKG shows no indication of ischemia. Nonetheless follow cardiac biomarkers. Consult cardiology. Monitor telemetry. Continue aspirin 2. Acute on chronic kidney failure: Hydrate with intravenous fluid. Avoid nephrotoxic agents.  3. Diabetes mellitus type 2: Continue basal insulin adjusted for hospital diet. Sliding insulin while hospitalized as well. Hold oral hypoglycemic agents. 4. Hypothyroidism: Check TSH; continue Synthroid 5. Coronary artery disease: Continue Imdur and Plavix 6. Hypertension:  Uncontrolled; continue amlodipine, metoprolol and lisinopril 7. Hyperlipidemia: Continue statin therapy 8. DVT prophylaxis: Heparin 9. GI prophylaxis: None The patient is a full code. Time spent on admission orders and patient care proximally 45 minutes  Harrie Foreman, MD 10/15/2016, 11:25 PM

## 2016-10-15 NOTE — ED Notes (Signed)
MD at bedside, family interprets for pt due to unable to sign back

## 2016-10-16 ENCOUNTER — Observation Stay: Payer: Medicare HMO

## 2016-10-16 LAB — GLUCOSE, CAPILLARY
Glucose-Capillary: 171 mg/dL — ABNORMAL HIGH (ref 65–99)
Glucose-Capillary: 94 mg/dL (ref 65–99)
Glucose-Capillary: 221 mg/dL — ABNORMAL HIGH (ref 65–99)
Glucose-Capillary: 256 mg/dL — ABNORMAL HIGH (ref 65–99)

## 2016-10-16 LAB — TROPONIN I
Troponin I: <0.03 ng/mL (ref <0.03)
Troponin I: <0.03 ng/mL (ref <0.03)

## 2016-10-16 LAB — CK: CK TOTAL: 75 U/L (ref 38–234)

## 2016-10-16 LAB — TSH: TSH: 2.192 u[IU]/mL (ref 0.350–4.500)

## 2016-10-16 LAB — SEDIMENTATION RATE: Sed Rate: 2 mm/hr (ref 0–30)

## 2016-10-16 MED ORDER — IPRATROPIUM-ALBUTEROL 0.5-2.5 (3) MG/3ML IN SOLN
3.0000 mL | RESPIRATORY_TRACT | Status: DC
  Administered 2016-10-16 – 2016-10-17 (×5): 3 mL via RESPIRATORY_TRACT
  Filled 2016-10-16 (×5): qty 3

## 2016-10-16 MED ORDER — ORAL CARE MOUTH RINSE
15.0000 mL | Freq: Two times a day (BID) | OROMUCOSAL | Status: DC
  Administered 2016-10-16 – 2016-10-17 (×2): 15 mL via OROMUCOSAL

## 2016-10-16 MED ORDER — TROLAMINE SALICYLATE 10 % EX CREA
TOPICAL_CREAM | CUTANEOUS | Status: DC | PRN
  Filled 2016-10-16: qty 85

## 2016-10-16 MED ORDER — INSULIN ASPART 100 UNIT/ML ~~LOC~~ SOLN
0.0000 [IU] | Freq: Three times a day (TID) | SUBCUTANEOUS | Status: DC
  Administered 2016-10-16: 11 [IU] via SUBCUTANEOUS
  Administered 2016-10-17: 7 [IU] via SUBCUTANEOUS
  Filled 2016-10-16: qty 11
  Filled 2016-10-16: qty 7

## 2016-10-16 MED ORDER — BUDESONIDE 0.25 MG/2ML IN SUSP
0.2500 mg | Freq: Two times a day (BID) | RESPIRATORY_TRACT | Status: DC
  Administered 2016-10-16 – 2016-10-17 (×2): 0.25 mg via RESPIRATORY_TRACT
  Filled 2016-10-16 (×3): qty 2

## 2016-10-16 NOTE — Care Management Obs Status (Signed)
MEDICARE OBSERVATION STATUS NOTIFICATION   Patient Details  Name: Connie HighlandSandra L Lefevre MRN: [id number] Date of Birth: [birth year]   Medicare Observation Status Notification Given:  Yes Signed by patient's son    Eber HongGreene, Adalaya Irion R, RN 10/16/2016, 9:40 AM

## 2016-10-16 NOTE — Care Management (Signed)
Placed in observation for chest pain.  Troponins negative.  Cardiology consult in progress.  Patient is deaf.  Lives with her son Genevie CheshireBilly.   Patient on chronic continuous home 02 through [place].  Has had [facility] in the past but no services now.  Has walker, bsc and "all the equipment we need."  Family will transport home.  No issues accessing medical care, paying for meds or with transportation

## 2016-10-16 NOTE — Progress Notes (Signed)
[facility] [place] [facility]   PATIENT NAME: Connie FischerSandra Wilkins    MR#:  [id number]  DATE OF BIRTH:  1933-07-11  SUBJECTIVE:  CHIEF COMPLAINT:   Chief Complaint  Patient presents with  . Chest Pain   The patient is a 83 year old Caucasian female with past medical history significant for history of COPD, diabetes, coronary artery disease, status post MI, who presents to the hospital with complaints of left shoulder, left upper arm, left chest area pain, worsening with pressure on the chest, arm, or movement of the shoulder. Patient is admitted to the hospital for further evaluation and treatment due to concerns of acute coronary syndrome. She admits of continuous discomfort and left shoulder area, left chest, worsening with movements. Cardiac enzymes are negative so far. cardiologist consultation is pending. Patient's family reports, the patient had a fall about 10 days ago on the left side of the body, has bruising in left hip area   Review of Systems  Constitutional: Negative for chills, fever and weight loss.  HENT: Negative for congestion.   Eyes: Negative for blurred vision and double vision.  Respiratory: Negative for cough, sputum production, shortness of breath and wheezing.   Cardiovascular: Positive for chest pain. Negative for palpitations, orthopnea, leg swelling and PND.  Gastrointestinal: Negative for abdominal pain, blood in stool, constipation, diarrhea, nausea and vomiting.  Genitourinary: Negative for dysuria, frequency, hematuria and urgency.  Musculoskeletal: Positive for falls, joint pain and myalgias.  Neurological: Negative for dizziness, tremors, focal weakness and headaches.  Endo/Heme/Allergies: Does not bruise/bleed easily.  Psychiatric/Behavioral: Negative for depression. The patient does not have insomnia.     VITAL SIGNS: Blood pressure 134/64, pulse (!) 59, temperature 98.2 F (36.8 C), temperature source Oral, resp. rate  20, height 5\' 3"  (1.6 m), weight 106.6 kg (235 lb), SpO2 95 %.  PHYSICAL EXAMINATION:   GENERAL:  83 y.o.-year-old patient lying in the bed with no acute distress.  EYES: Pupils equal, round, reactive to light and accommodation. No scleral icterus. Extraocular muscles intact.  HEENT: Head atraumatic, normocephalic. Oropharynx and nasopharynx clear.  NECK:  Supple, no jugular venous distention. No thyroid enlargement, no tenderness.  LUNGS: Normal breath sounds bilaterally, no wheezing, rales,rhonchi or crepitation. No use of accessory muscles of respiration.  CARDIOVASCULAR: S1, S2 normal. No murmurs, rubs, or gallops. Painful chest palpation on the left, left shoulder. Palpation left upper arm palpation. Muscle discomfort on palpation as well. Patient is able to lift up her arms bilaterally. There is no significant shoulder abnormalities,  on exam, although uncomfortable to palpation, somewhat restricted range of motion on passive movements, patient grimaces  ABDOMEN: Soft, nontender, nondistended. Bowel sounds present. No organomegaly or mass.  EXTREMITIES: No pedal edema, cyanosis, or clubbing.  NEUROLOGIC: Cranial nerves II through XII are intact. Muscle strength 5/5 in all extremities. Sensation intact. Gait not checked.  PSYCHIATRIC: The patient is alert and oriented x 3. The patient is deaf  SKIN: No obvious rash, lesion, or ulcer.   ORDERS/RESULTS REVIEWED:   CBC  Recent Labs Lab 10/15/16 1513  WBC 6.6  HGB 12.1  HCT 36.7  PLT 275  MCV 80.8  MCH 26.6  MCHC 32.9  RDW 15.6*   ------------------------------------------------------------------------------------------------------------------  Chemistries   Recent Labs Lab 10/15/16 1513  NA 141  K 3.5  CL 104  CO2 29  GLUCOSE 184*  BUN 32*  CREATININE 1.43*  CALCIUM 9.3   ------------------------------------------------------------------------------------------------------------------ estimated creatinine clearance  is 36.7 mL/min (A) (by  C-G formula based on SCr of 1.43 mg/dL (H)). ------------------------------------------------------------------------------------------------------------------  Recent Labs  10/15/16 2338  TSH 2.192    Cardiac Enzymes  Recent Labs Lab 10/15/16 2338 10/16/16 0451 10/16/16 1121  TROPONINI <0.03 <0.03 <0.03   ------------------------------------------------------------------------------------------------------------------ Invalid input(s): POCBNP ---------------------------------------------------------------------------------------------------------------  RADIOLOGY: Dg Chest 1 View  Result Date: 10/15/2016 CLINICAL DATA:  Left-sided chest pain since Sunday EXAM: CHEST 1 VIEW COMPARISON:  01/10/2016 FINDINGS: Borderline cardiomegaly. Aortic atherosclerosis at the arch without aneurysmal dilatation. No pneumonic consolidation or overt pulmonary edema. No pneumothorax nor effusion. The visualized skeletal structures are unremarkable. IMPRESSION: No active disease. Electronically Signed   By: David  Kwon M.D.   On: 10/15/2016 17:46   Dg Shoulder Left  Result Date: 10/16/2016 CLINICAL DATA:  Fall.  Left shoulder pain.  Initial encounter. EXAM: LEFT SHOULDER - 2+ VIEW COMPARISON:  None. FINDINGS: There is no evidence of fracture or dislocation. There is no evidence of arthropathy or other focal bone abnormality. Soft tissues are unremarkable. IMPRESSION: Negative. Electronically Signed   By: Allen  Grady M.D.   On: 10/16/2016 10:55    EKG:  Orders placed or performed during the hospital encounter of 10/15/16  . EKG 12-Lead  . EKG 12-Lead  . ED EKG within 10 minutes  . ED EKG within 10 minutes    ASSESSMENT AND PLAN:  Active Problems:   Chest pain #1 left-sided chest pain, left shoulder pain, upper arm pain, likely musculoskeletal, cardiac enzymes are negative so far, cardiac, GI consultation is pending, continue patient on aspirin, Plavix, Lipitor, Imdur,  metoprolol. Left shoulder x-ray was unremarkable, sedimentation rate was normal, TSH was normal, CK level is normal, unlikely PMR, arthritis, rhabdomyolysis. Normal myocardial perfusion test in 2010 #2. CK D stage III, stable #3. Essential hypertension, well-controlled #4 diabetes mellitus type 2, continue outpatient medications, sliding scale insulin, diabetic diet   Management plans discussed with the patient, family and they are in agreement.   DRUG ALLERGIES: No Known Allergies  CODE STATUS:     Code Status Orders        Start     Ordered   10/15/16 2211  Full code  Continuous     05 /15/18 2210    Code Status History    Date Active Date Inactive Code Status Order ID Comments User Context   01/10/2016  5:17 PM 01/12/2016  8:07 PM Full Code [id number]  Houston Siren, MD Inpatient      TOTAL TIME TAKING CARE OF THIS PATIENT: 40  minutes.    [name] M.D on 10/16/2016 at 2:28 PM  Between 7am to 6pm - Pager - [phone]  After 6pm go to [email or web address] - password [email or web address] [facility]  [facility]  Office  [phone]  CC: Primary care physician; Leanna Sato, MD

## 2016-10-16 NOTE — Consult Note (Signed)
[facility] Cardiology Consultation Note  Patient ID: Connie Wilkins, MRN: [id number], DOB/AGE: 08-23-1933 83 y.o. Admit date: 10/15/2016   Date of Consult: 10/16/2016 Primary Physician: Leanna Sato, MD Primary Cardiologist: Gwen Pounds  Chief Complaint:  Chief Complaint  Patient presents with  . Chest Pain   Reason for Consult: chest pain  HPI: 83 y.o. female with known coronary artery disease status post previous myocardial infarction PCI and stent placement with diabetes with complication essential hypertension mixed hyperlipidemia previously on appropriate medication management for continued treatment of this issue. She was recently seen in the office for episodes of chest discomfort atypical in nature and was scheduled for stress test. At that time she was stable but then had a fall to her left hip and left shoulder. After that fall she was recovering and now has had some left upper chest discomfort and shoulder discomfort for which she thought she had a heart attack. Her EKG shows normal sinus rhythm with old septal myocardial infarction unchanged from before and her troponins are normal. She has had full resolution of chest discomfort with her current medical regimen and is continuing to be on appropriate medication management and hemodynamically stable.  Past Medical History:  Diagnosis Date  . COPD (chronic obstructive pulmonary disease) (HCC)   . Diabetes mellitus without complication (HCC)   . MI, old       Surgical History:  Past Surgical History:  Procedure Laterality Date  . CORONARY STENT PLACEMENT       Home Meds: Prior to Admission medications   Medication Sig Start Date End Date Taking? Authorizing Provider  amLODipine (NORVASC) 5 MG tablet Take 1 tablet by mouth daily. 11/27/15  Yes [provider]  aspirin EC 81 MG tablet Take 81 mg by mouth daily.   Yes [provider]  atorvastatin (LIPITOR) 80 MG tablet Take 1 tablet by mouth daily at 6  PM. 12/16/15  Yes [provider]  clopidogrel (PLAVIX) 75 MG tablet Take 1 tablet by mouth daily. 12/16/15  Yes [provider]  furosemide (LASIX) 20 MG tablet Take 1 tablet by mouth daily. 12/25/15  Yes [provider]  gabapentin (NEURONTIN) 600 MG tablet Take 1 tablet by mouth at bedtime.  11/06/15  Yes [provider]  GLIPIZIDE XL 5 MG 24 hr tablet Take 1 tablet by mouth daily. 12/16/15  Yes [provider]  ibuprofen (ADVIL,MOTRIN) 800 MG tablet Take 1 tablet (800 mg total) by mouth 2 (two) times daily as needed. 01/12/16  Yes Enedina Finner, MD  insulin aspart (NOVOLOG) 100 UNIT/ML injection Inject 15 Units into the skin 2 (two) times daily with a meal. Takes with Lunch and Dinner   Yes [provider]  Insulin Detemir (LEVEMIR FLEXPEN) 100 UNIT/ML Pen Inject 40 Units into the skin daily at 10 pm.   Yes [provider]  ipratropium-albuterol (DUONEB) 0.5-2.5 (3) MG/3ML SOLN Take 3 mLs by nebulization 3 (three) times daily. 01/12/16  Yes Enedina Finner, MD  isosorbide mononitrate (IMDUR) 60 MG 24 hr tablet Take 1 tablet by mouth at bedtime.  12/21/15  Yes [provider]  levothyroxine (SYNTHROID, LEVOTHROID) 25 MCG tablet Take 1 tablet by mouth daily. 12/25/15  Yes [provider]  lisinopril (PRINIVIL,ZESTRIL) 40 MG tablet Take 1 tablet by mouth daily. 12/25/15  Yes [provider]  metoprolol tartrate (LOPRESSOR) 25 MG tablet Take 75 mg by mouth every morning.  01/08/16  Yes [provider]  azithromycin (ZITHROMAX) 250 MG  tablet Take as directed Patient not taking: Reported on 10/15/2016 01/13/16   Enedina FinnerPatel, Sona, MD  predniSONE (DELTASONE) 10 MG tablet Take 50 mg daily. Taper by 10 mg daily then stop Patient not taking: Reported on 10/15/2016 01/12/16   Enedina FinnerPatel, Sona, MD    Inpatient Medications:  . amLODipine  5 mg Oral Daily  . aspirin EC  81 mg Oral Daily  . atorvastatin  80 mg Oral q1800  . budesonide  (PULMICORT) nebulizer solution  0.25 mg Nebulization BID  . clopidogrel  75 mg Oral Daily  . docusate sodium  100 mg Oral BID  . furosemide  20 mg Oral Daily  . gabapentin  600 mg Oral QHS  . heparin  5,000 Units Subcutaneous Q8H  . insulin aspart  0-20 Units Subcutaneous TID WC  . insulin detemir  24 Units Subcutaneous QHS  . ipratropium-albuterol  3 mL Nebulization Q4H  . isosorbide mononitrate  60 mg Oral QHS  . levothyroxine  25 mcg Oral QAC breakfast  . lisinopril  40 mg Oral Daily  . mouth rinse  15 mL Mouth Rinse BID  . metoprolol tartrate  75 mg Oral BH-q7a     Allergies: No Known Allergies  Social History   Social History  . Marital status: Married    Spouse name: N/A  . Number of children: N/A  . Years of education: N/A   Occupational History  . Not on file.   Social History Main Topics  . Smoking status: Passive Smoke Exposure - Never Smoker  . Smokeless tobacco: Never Used  . Alcohol use No  . Drug use: No  . Sexual activity: No   Other Topics Concern  . Not on file   Social History Narrative  . No narrative on file     Family History  Problem Relation Age of Onset  . Breast cancer Mother   . COPD Father      Review of Systems Positive for Left arm and shoulder pain with chest pain Negative for: General:  chills, fever, night sweats or weight changes.  Cardiovascular: PND orthopnea syncope dizziness  Dermatological skin lesions rashes Respiratory: Cough congestion Urologic: Frequent urination urination at night and hematuria Abdominal: negative for nausea, vomiting, diarrhea, bright red blood per rectum, melena, or hematemesis Neurologic: negative for visual changes, and/or hearing changes  All other systems reviewed and are otherwise negative except as noted above.  Labs:  Recent Labs  10/15/16 1513 10/15/16 2338 10/16/16 0451 10/16/16 1121  CKTOTAL  --   --   --  75  TROPONINI <0.03 <0.03 <0.03 <0.03   Lab Results  Component  Value Date   WBC 6.6 10/15/2016   HGB 12.1 10/15/2016   HCT 36.7 10/15/2016   MCV 80.8 10/15/2016   PLT 275 10/15/2016    Recent Labs Lab 10/15/16 1513  NA 141  K 3.5  CL 104  CO2 29  BUN 32*  CREATININE 1.43*  CALCIUM 9.3  GLUCOSE 184*   Lab Results  Component Value Date   CHOL 157 04/15/2014   HDL 27 (L) 04/15/2014   LDLCALC 55 04/15/2014   TRIG 374 (H) 04/15/2014   No results found for: DDIMER  Radiology/Studies:  Dg Chest 1 View  Result Date: 10/15/2016 CLINICAL DATA:  Left-sided chest pain since Sunday EXAM: CHEST 1 VIEW COMPARISON:  01/10/2016 FINDINGS: Borderline cardiomegaly. Aortic atherosclerosis at the arch without aneurysmal dilatation. No pneumonic consolidation or overt pulmonary edema. No pneumothorax nor effusion. The visualized skeletal structures  are unremarkable. IMPRESSION: No active disease. Electronically Signed   By: Tollie Eth M.D.   On: 10/15/2016 17:46   Dg Shoulder Left  Result Date: 10/16/2016 CLINICAL DATA:  Fall.  Left shoulder pain.  Initial encounter. EXAM: LEFT SHOULDER - 2+ VIEW COMPARISON:  None. FINDINGS: There is no evidence of fracture or dislocation. There is no evidence of arthropathy or other focal bone abnormality. Soft tissues are unremarkable. IMPRESSION: Negative. Electronically Signed   By: Sebastian Ache M.D.   On: 10/16/2016 10:55    EKG: Normal sinus rhythm with old septal infarct age undetermined  Weights: Filed Weights   10/15/16 1511 10/15/16 2200 10/16/16 0416  Weight: 99.8 kg (220 lb) 106.9 kg (235 lb 11.2 oz) 106.6 kg (235 lb)     Physical Exam: Blood pressure 134/64, pulse (!) 59, temperature 98.2 F (36.8 C), temperature source Oral, resp. rate 20, height 5\' 3"  (1.6 m), weight 106.6 kg (235 lb), SpO2 95 %. Body mass index is 41.63 kg/m. General: Well developed, well nourished, in no acute distress. Head eyes ears nose throat: Normocephalic, atraumatic, sclera non-icteric, no xanthomas, nares are without  discharge. No apparent thyromegaly and/or mass  Lungs: Normal respiratory effort.  no wheezes, no rales, Some rhonchi.  Heart: RRR with normal S1 S2. no murmur gallop, no rub, PMI is normal size and placement, carotid upstroke normal without bruit, jugular venous pressure is normal Abdomen: Soft, non-tender, non-distended with normoactive bowel sounds. No hepatomegaly. No rebound/guarding. No obvious abdominal masses. Abdominal aorta is normal size without bruit Extremities: Trace edema. no cyanosis, no clubbing, no ulcers  Peripheral : 2+ bilateral upper extremity pulses, 2+ bilateral femoral pulses, 2+ bilateral dorsal pedal pulse Neuro: Alert and oriented. No facial asymmetry. No focal deficit. Moves all extremities spontaneously. Musculoskeletal: Normal muscle tone without kyphosis Psych:  Responds to questions appropriately with a normal affect.    Assessment: 83 year old female with coronary artery disease is post coronary artery stenting myocardial infarction essential hypertension mixed hyperlipidemia diabetes with complications with atypical left upper chest discomfort and no current evidence of heart failure or myocardial infarction  Plan: 1. Continue medication management for further risk reduction of cardiovascular event including isosorbide for chest pain 2. Antiplatelet medication management including Plavix and aspirin 3. High intensity cholesterol therapy with atorvastatin 4. Blood pressure control with metoprolol and amlodipine 5. Furosemide for lower extremity edema 6. Begin ambulation if patient feels relatively well with no exacerbation of symptoms would consider discharge to home with outpatient stress test as previously ordered  Signed, Lamar Blinks M.D. [facility] Cardiology 10/16/2016, 5:23 PM

## 2016-10-16 NOTE — Plan of Care (Signed)
Problem: Pain Managment: Goal: General experience of comfort will improve Outcome: Progressing Pain free on arrival to floor.  No distress noted.

## 2016-10-17 DIAGNOSIS — N183 Chronic kidney disease, stage 3 unspecified: Secondary | ICD-10-CM

## 2016-10-17 DIAGNOSIS — E669 Obesity, unspecified: Secondary | ICD-10-CM

## 2016-10-17 DIAGNOSIS — E1169 Type 2 diabetes mellitus with other specified complication: Secondary | ICD-10-CM

## 2016-10-17 DIAGNOSIS — R0789 Other chest pain: Secondary | ICD-10-CM

## 2016-10-17 DIAGNOSIS — I10 Essential (primary) hypertension: Secondary | ICD-10-CM

## 2016-10-17 DIAGNOSIS — R0600 Dyspnea, unspecified: Secondary | ICD-10-CM

## 2016-10-17 LAB — HEMOGLOBIN A1C
Hgb A1c MFr Bld: 6.4 % — ABNORMAL HIGH (ref 4.8–5.6)
Mean Plasma Glucose: 137 mg/dL

## 2016-10-17 LAB — GLUCOSE, CAPILLARY
Glucose-Capillary: 209 mg/dL — ABNORMAL HIGH (ref 65–99)
Glucose-Capillary: 176 mg/dL — ABNORMAL HIGH (ref 65–99)

## 2016-10-17 MED ORDER — INSULIN ASPART 100 UNIT/ML ~~LOC~~ SOLN
3.0000 [IU] | Freq: Three times a day (TID) | SUBCUTANEOUS | Status: DC
  Administered 2016-10-17: 3 [IU] via SUBCUTANEOUS
  Filled 2016-10-17: qty 3

## 2016-10-17 MED ORDER — PREDNISONE 10 MG PO TABS: 10.0000 mg | ORAL_TABLET | Freq: Every day | ORAL | 0 refills | Status: DC

## 2016-10-17 MED ORDER — AMLODIPINE BESYLATE 10 MG PO TABS: 10.0000 mg | ORAL_TABLET | Freq: Every day | ORAL | 3 refills | Status: AC

## 2016-10-17 MED ORDER — AMLODIPINE BESYLATE 10 MG PO TABS
10.0000 mg | ORAL_TABLET | Freq: Every day | ORAL | Status: DC
  Administered 2016-10-17: 10 mg via ORAL
  Filled 2016-10-17: qty 1

## 2016-10-17 MED ORDER — INSULIN DETEMIR 100 UNIT/ML ~~LOC~~ SOLN
28.0000 [IU] | Freq: Every day | SUBCUTANEOUS | Status: DC
  Filled 2016-10-17: qty 0.28

## 2016-10-17 MED ORDER — BUDESONIDE-FORMOTEROL FUMARATE 160-4.5 MCG/ACT IN AERO: 2.0000 | INHALATION_SPRAY | Freq: Two times a day (BID) | RESPIRATORY_TRACT | 12 refills | Status: AC

## 2016-10-17 MED ORDER — IPRATROPIUM-ALBUTEROL 0.5-2.5 (3) MG/3ML IN SOLN: 3.0000 mL | RESPIRATORY_TRACT | 3 refills | Status: AC

## 2016-10-17 MED ORDER — INSULIN ASPART 100 UNIT/ML ~~LOC~~ SOLN
0.0000 [IU] | Freq: Three times a day (TID) | SUBCUTANEOUS | Status: DC
  Administered 2016-10-17: 3 [IU] via SUBCUTANEOUS
  Filled 2016-10-17: qty 3

## 2016-10-17 MED ORDER — TROLAMINE SALICYLATE 10 % EX CREA: TOPICAL_CREAM | CUTANEOUS | 0 refills | Status: AC | PRN

## 2016-10-17 MED ORDER — IPRATROPIUM-ALBUTEROL 0.5-2.5 (3) MG/3ML IN SOLN
3.0000 mL | Freq: Three times a day (TID) | RESPIRATORY_TRACT | Status: DC
  Filled 2016-10-17: qty 3

## 2016-10-17 NOTE — Progress Notes (Signed)
[facility]  Patient: Connie Wilkins / Admit Date: 10/15/2016 / Date of Encounter: 10/17/2016, 8:51 AM   Subjective: No apparent recurrent chest discomfort overnight. Patient resting comfortably overnight. No evidence of troponin elevation or evidence of myocardial infarction. Telemetry shows normal sinus rhythm  Review of Systems: Positive for: Weakness Negative for: Vision change, hearing change, syncope, dizziness, nausea, vomiting,diarrhea, bloody stool, stomach pain, cough, congestion, diaphoresis, urinary frequency, urinary pain,skin lesions, skin rashes Others previously listed  Objective: Telemetry: Normal sinus rhythm Physical Exam: Blood pressure (!) 150/104, pulse 75, temperature 98.3 F (36.8 C), temperature source Oral, resp. rate 19, height 5\' 3"  (1.6 m), weight 107.6 kg (237 lb 4.8 oz), SpO2 95 %. Body mass index is 42.04 kg/m. General: Well developed, well nourished, in no acute distress. Head: Normocephalic, atraumatic, sclera non-icteric, no xanthomas, nares are without discharge. Neck: No apparent masses Lungs: Normal respirations with no wheezes, no rhonchi, no rales , no crackles   Heart: Regular rate and rhythm, normal S1 S2, no murmur, no rub, no gallop, PMI is normal size and placement, carotid upstroke normal without bruit, jugular venous pressure normal Abdomen: Soft, non-tender, non-distended with normoactive bowel sounds. No hepatosplenomegaly. Abdominal aorta is normal size without bruit Extremities: No edema, no clubbing, no cyanosis, no ulcers,  Peripheral: 2+ radial, 2+ femoral, 2+ dorsal pedal pulses Neuro: Alert and oriented. Moves all extremities spontaneously. Psych:  Responds to questions appropriately with a normal affect.   Intake/Output Summary (Last 24 hours) at 10/17/16 0851 Last data filed at 10/17/16 0830  Gross per 24 hour  Intake           810.42 ml  Output              700 ml  Net           110.42  ml    Inpatient Medications:  . amLODipine  10 mg Oral Daily  . aspirin EC  81 mg Oral Daily  . atorvastatin  80 mg Oral q1800  . budesonide (PULMICORT) nebulizer solution  0.25 mg Nebulization BID  . clopidogrel  75 mg Oral Daily  . docusate sodium  100 mg Oral BID  . furosemide  20 mg Oral Daily  . gabapentin  600 mg Oral QHS  . heparin  5,000 Units Subcutaneous Q8H  . insulin aspart  0-20 Units Subcutaneous TID AC & HS  . insulin detemir  24 Units Subcutaneous QHS  . ipratropium-albuterol  3 mL Nebulization Q4H  . isosorbide mononitrate  60 mg Oral QHS  . levothyroxine  25 mcg Oral QAC breakfast  . lisinopril  40 mg Oral Daily  . mouth rinse  15 mL Mouth Rinse BID  . metoprolol tartrate  75 mg Oral BH-q7a   Infusions:   Labs:  Recent Labs  10/15/16 1513  NA 141  K 3.5  CL 104  CO2 29  GLUCOSE 184*  BUN 32*  CREATININE 1.43*  CALCIUM 9.3   No results for input(s): AST, ALT, ALKPHOS, BILITOT, PROT, ALBUMIN in the last 72 hours.  Recent Labs  10/15/16 1513  WBC 6.6  HGB 12.1  HCT 36.7  MCV 80.8  PLT 275    Recent Labs  10/15/16 1513 10/15/16 2338 10/16/16 0451 10/16/16 1121  CKTOTAL  --   --   --  75  TROPONINI <0.03 <0.03 <0.03 <0.03   Invalid input(s): POCBNP  Recent Labs  10/15/16 2338  HGBA1C 6.4*     Weights:  Filed Weights   10/15/16 2200 10/16/16 0416 10/17/16 0359  Weight: 106.9 kg (235 lb 11.2 oz) 106.6 kg (235 lb) 107.6 kg (237 lb 4.8 oz)     Radiology/Studies:  Dg Chest 1 View  Result Date: 10/15/2016 CLINICAL DATA:  Left-sided chest pain since Sunday EXAM: CHEST 1 VIEW COMPARISON:  01/10/2016 FINDINGS: Borderline cardiomegaly. Aortic atherosclerosis at the arch without aneurysmal dilatation. No pneumonic consolidation or overt pulmonary edema. No pneumothorax nor effusion. The visualized skeletal structures are unremarkable. IMPRESSION: No active disease. Electronically Signed   By: Tollie Eth M.D.   On: 10/15/2016 17:46   Dg  Shoulder Left  Result Date: 10/16/2016 CLINICAL DATA:  Fall.  Left shoulder pain.  Initial encounter. EXAM: LEFT SHOULDER - 2+ VIEW COMPARISON:  None. FINDINGS: There is no evidence of fracture or dislocation. There is no evidence of arthropathy or other focal bone abnormality. Soft tissues are unremarkable. IMPRESSION: Negative. Electronically Signed   By: Sebastian Ache M.D.   On: 10/16/2016 10:55     Assessment and Recommendation  83 y.o. female with known coronary artery disease status post previous old myocardial infarction PCI stent placement essential hypertension makes hyperlipidemia chronic kidney disease stage III with diabetes with complication having atypical left upper chest discomfort and shoulder pain without evidence of myocardial infarction and/or congestive heart failure 1. Continue medication management for further risk reduction in cardiovascular event including metoprolol and amlodipine for hypertension control 2. Isosorbide for chest discomfort 3. High intensity cholesterol therapy with atorvastatin 4. Begin ambulation and follow for worsening symptoms and need for adjustments of medication management line 5. No further cardiac diagnostics necessary at this time and would consider discharge to home if ambulating well with outpatient evaluation and treatment options   Signed, Connie Wilkins M.D. FACC

## 2016-10-17 NOTE — Progress Notes (Signed)
Discharge instructions gone over with patient along with home medication list through written communincation. Son at bedside. Patient wrote that she understood instruction. Five printed prescriptions given to patient. Iv and telemetry removed. Patients has personal 02 tank. Son to transport patient home, [facility]

## 2016-10-17 NOTE — Progress Notes (Signed)
Inpatient Diabetes Program Recommendations  AACE/ADA: New Consensus Statement on Inpatient Glycemic Control (2015)  Target Ranges:  Prepandial:   less than 140 mg/dL      Peak postprandial:   less than 180 mg/dL (1-2 hours)      Critically ill patients:  140 - 180 mg/dL   Lab Results  Component Value Date   GLUCAP 209 (H) 10/17/2016   HGBA1C 6.4 (H) 10/15/2016    Review of Glycemic Control  Results for [name], Connie Wilkins (MRN [id number]) as of 10/17/2016 11:58  Ref. Range 10/16/2016 07:38 10/16/2016 11:59 10/16/2016 16:23 10/16/2016 21:23 10/17/2016 07:45  Glucose-Capillary Latest Ref Range: 65 - 99 mg/dL 409171 (H) 94 811221 (H) 914256 (H) 209 (H)    Diabetes history: Type 2 Outpatient Diabetes medications: Glipizide 5mg  qday, Novolog 15 units with lunch and supper, Levemir 40 units qhs  Current orders for Inpatient glycemic control: Levemir 24 units qhs, Novolog 0-20 units tid and hs  Inpatient Diabetes Program Recommendations:     Please consider increasing Levemir to 28 units qhs  Add Novolog 3 units tid with meals (hold if she eats less than 50%)  Decrease Novolog correction (from 0-20 units) to moderate correction scale 0-15 units tid  Decrease Novolog hs correction from 0-20 units to 0-5 units  Text page to MD at 12:05pm  Susette RacerJulie Chaysen Tillman, RN, BA, [place]HA, CDE Diabetes Coordinator Inpatient Diabetes Program  [phone] (Team Pager) [phone] [facility] Office) 10/17/2016 12:03 PM

## 2016-10-17 NOTE — Care Management Note (Signed)
Case Management Note  Patient Details  Name: Connie Wilkins MRN: [id number] Date of Birth: 10/16/1933  Subjective/Objective:                 To discharge home today with home health. [facility]   Action/Plan:  Called referral for SN and PT to [facility].  Communicated that patient is deaf and there will be family member present that can hear for visits.  Confirmed contact information    Expected Discharge Date:  10/17/16               Expected Discharge Plan:     In-House Referral:     Discharge planning Services     Post Acute Care Choice:    Choice offered to:     DME Arranged:    DME Agency:     HH Arranged:    HH Agency:     Status of Service:     If discussed at [employer] Meetings, dates discussed:    Additional Comments:  Connie Wilkins, Connie Egolf R, RN 10/17/2016, 1:30 PM

## 2016-10-17 NOTE — Discharge Summary (Signed)
[facility] - [place] at [facility]   PATIENT NAME: Connie Wilkins    MR#:  [id number]  DATE OF BIRTH:  Sep 15, 1933  DATE OF ADMISSION:  10/15/2016 ADMITTING PHYSICIAN: [name], MD  DATE OF DISCHARGE: 10/17/2016  2:00 PM  PRIMARY CARE PHYSICIAN: Leanna Sato, MD     ADMISSION DIAGNOSIS:  Chest pain [R07.9] Chest pain, unspecified type [R07.9]  DISCHARGE DIAGNOSIS:  Active Problems:   COPD with acute exacerbation [facility])   Musculoskeletal chest pain   Essential hypertension   CKD (chronic kidney disease), stage III   Dyspnea   Diabetes mellitus type 2 in obese (HCC)   SECONDARY DIAGNOSIS:   Past Medical History:  Diagnosis Date  . COPD (chronic obstructive pulmonary disease) (HCC)   . Diabetes mellitus without complication (HCC)   . MI, old     .pro HOSPITAL COURSE:   The patient is a 83 year old Caucasian female with past medical history significant for history of COPD, diabetes, coronary artery disease, status post MI, who presents to the hospital with complaints of left shoulder, left upper arm, left chest area pain, worsening with pressure on the chest, arm, or movement of the shoulder. Patient is admitted to the hospital for further evaluation and treatment due to concerns of acute coronary syndrome. She admited of continuous discomfort in left shoulder area, left chest, worsening with movements, breathing. Cardiac enzymes were negative. . Cardiologist saw patient in consultation and recommended supportive care, but no further diagnostics, follow-up as outpatient. Patient's son reported the patient falling about 10 days ago on the left side of the body, bruising left hip, left shoulder area. While in the hospital patient was noted to be short of breath, wheezing, intermittently coughing, not producing significant sputum. She was initiated on steroid inhalers, nebulizing therapy and clinically improved. She was felt to be stable to be  discharged home today with home health follow-up. Discussion by problem: #1 left-sided chest pain, left shoulder pain, upper arm pain, likely musculoskeletal, cardiac enzymes were negative. The patient was seen by cardiologist recommended no further evaluations, , continue patient on aspirin, Plavix, Lipitor, Imdur, metoprolol. Left shoulder x-ray was unremarkable, sedimentation rate was normal, TSH was normal, CK level is normal, unlikely PMR, arthritis, rhabdomyolysis. The patient is to follow up with cardiologist as outpatient #2. CK D stage III, stable #3. Essential hypertension, advanced Norvasc to 10 mg daily dose, follow-up with primary care physician and advance even higher if needed #4 diabetes mellitus type 2, continue outpatient medications, sliding scale insulin, diabetic diet #5. COPD exacerbation due to bronchitis, patient is to continue Zithromax, nebulizing therapy every 4 hours, adding Symbicort, she is to follow-up with primary care physician for further recommendations. She will have a home health nurse following  her progression as outpatient. Patient's oxygenation remained stable and 95-97% on her usual 2 L of oxygen DISCHARGE CONDITIONS:   Stable  CONSULTS OBTAINED:  Treatment Team:  Lamar Blinks, MD  DRUG ALLERGIES:  No Known Allergies  DISCHARGE MEDICATIONS:   Discharge Medication List as of 10/17/2016  1:17 PM    START taking these medications   Details  budesonide-formoterol (SYMBICORT) 160-4.5 MCG/ACT inhaler Inhale 2 puffs into the lungs 2 (two) times daily., Starting Thu 10/17/2016, Print    trolamine salicylate (ASPERCREME) 10 % cream Apply topically as needed for muscle pain (Left shoulder, upper arm. Musculoskeletal pains)., Starting Thu 10/17/2016, Print      CONTINUE these medications which have CHANGED  Details  amLODipine (NORVASC) 10 MG tablet Take 1 tablet (10 mg total) by mouth daily., Starting Fri 10/18/2016, Print    ipratropium-albuterol  (DUONEB) 0.5-2.5 (3) MG/3ML SOLN Take 3 mLs by nebulization every 4 (four) hours., Starting Thu 10/17/2016, Print    predniSONE (DELTASONE) 10 MG tablet Take 1 tablet (10 mg total) by mouth daily with breakfast. Please take 6 pills in the morning on the day 1, then taper by one pill daily until finished, thank you, Starting Thu 10/17/2016, Print      CONTINUE these medications which have NOT CHANGED   Details  aspirin EC 81 MG tablet Take 81 mg by mouth daily., Historical Med    atorvastatin (LIPITOR) 80 MG tablet Take 1 tablet by mouth daily at 6 PM., Starting Sat 12/16/2015, Historical Med    clopidogrel (PLAVIX) 75 MG tablet Take 1 tablet by mouth daily., Starting Sat 12/16/2015, Historical Med    furosemide (LASIX) 20 MG tablet Take 1 tablet by mouth daily., Starting Mon 12/25/2015, Historical Med    gabapentin (NEURONTIN) 600 MG tablet Take 1 tablet by mouth at bedtime. , Starting Mon 11/06/2015, Historical Med    GLIPIZIDE XL 5 MG 24 hr tablet Take 1 tablet by mouth daily., Starting Sat 12/16/2015, Historical Med    insulin aspart (NOVOLOG) 100 UNIT/ML injection Inject 15 Units into the skin 2 (two) times daily with a meal. Takes with Lunch and Dinner, Historical Med    Insulin Detemir (LEVEMIR FLEXPEN) 100 UNIT/ML Pen Inject 40 Units into the skin daily at 10 pm., Historical Med    isosorbide mononitrate (IMDUR) 60 MG 24 hr tablet Take 1 tablet by mouth at bedtime. , Starting Thu 12/21/2015, Historical Med    levothyroxine (SYNTHROID, LEVOTHROID) 25 MCG tablet Take 1 tablet by mouth daily., Starting Mon 12/25/2015, Historical Med    lisinopril (PRINIVIL,ZESTRIL) 40 MG tablet Take 1 tablet by mouth daily., Starting Mon 12/25/2015, Historical Med    metoprolol tartrate (LOPRESSOR) 25 MG tablet Take 75 mg by mouth every morning. , Starting Mon 01/08/2016, Historical Med    azithromycin (ZITHROMAX) 250 MG tablet Take as directed, Normal      STOP taking these medications     ibuprofen  (ADVIL,MOTRIN) 800 MG tablet          DISCHARGE INSTRUCTIONS:    The patient is to follow-up with primary care physician and cardiologist as outpatient  If you experience worsening of your admission symptoms, develop shortness of breath, life threatening emergency, suicidal or homicidal thoughts you must seek medical attention immediately by calling 911 or calling your MD immediately  if symptoms less severe.  You Must read complete instructions/literature along with all the possible adverse reactions/side effects for all the Medicines you take and that have been prescribed to you. Take any new Medicines after you have completely understood and accept all the possible adverse reactions/side effects.   Please note  You were cared for by a hospitalist during your hospital stay. If you have any questions about your discharge medications or the care you received while you were in the hospital after you are discharged, you can call the unit and asked to speak with the hospitalist on call if the hospitalist that took care of you is not available. Once you are discharged, your primary care physician will handle any further medical issues. Please note that NO REFILLS for any discharge medications will be authorized once you are discharged, as it is imperative that you return to your primary  care physician (or establish a relationship with a primary care physician if you do not have one) for your aftercare needs so that they can reassess your need for medications and monitor your lab values.    Today   CHIEF COMPLAINT:   Chief Complaint  Patient presents with  . Chest Pain    HISTORY OF PRESENT ILLNESS:    VITAL SIGNS:  Blood pressure (!) 145/70, pulse 66, temperature 97.3 F (36.3 C), temperature source Oral, resp. rate 18, height 5\' 3"  (1.6 m), weight 107.6 kg (237 lb 4.8 oz), SpO2 94 %.  I/O:   Intake/Output Summary (Last 24 hours) at 10/17/16 1535 Last data filed at 10/17/16 1031   Gross per 24 hour  Intake              240 ml  Output              500 ml  Net             -260 ml    PHYSICAL EXAMINATION:  GENERAL:  83 y.o.-year-old patient lying in the bed with no acute distress.  EYES: Pupils equal, round, reactive to light and accommodation. No scleral icterus. Extraocular muscles intact.  HEENT: Head atraumatic, normocephalic. Oropharynx and nasopharynx clear.  NECK:  Supple, no jugular venous distention. No thyroid enlargement, no tenderness.  LUNGS: Normal breath sounds bilaterally, no wheezing, rales,rhonchi or crepitation. No use of accessory muscles of respiration.  CARDIOVASCULAR: S1, S2 normal. No murmurs, rubs, or gallops.  ABDOMEN: Soft, non-tender, non-distended. Bowel sounds present. No organomegaly or mass.  EXTREMITIES: No pedal edema, cyanosis, or clubbing.  NEUROLOGIC: Cranial nerves II through XII are intact. Muscle strength 5/5 in all extremities. Sensation intact. Gait not checked.  PSYCHIATRIC: The patient is alert and oriented x 3.  SKIN: No obvious rash, lesion, or ulcer.   DATA REVIEW:   CBC  Recent Labs Lab 10/15/16 1513  WBC 6.6  HGB 12.1  HCT 36.7  PLT 275    Chemistries   Recent Labs Lab 10/15/16 1513  NA 141  K 3.5  CL 104  CO2 29  GLUCOSE 184*  BUN 32*  CREATININE 1.43*  CALCIUM 9.3    Cardiac Enzymes  Recent Labs Lab 10/16/16 1121  TROPONINI <0.03    Microbiology Results  No results found for this or any previous visit.  RADIOLOGY:  Dg Chest 1 View  Result Date: 10/15/2016 CLINICAL DATA:  Left-sided chest pain since Sunday EXAM: CHEST 1 VIEW COMPARISON:  01/10/2016 FINDINGS: Borderline cardiomegaly. Aortic atherosclerosis at the arch without aneurysmal dilatation. No pneumonic consolidation or overt pulmonary edema. No pneumothorax nor effusion. The visualized skeletal structures are unremarkable. IMPRESSION: No active disease. Electronically Signed   By: Tollie Eth M.D.   On: 10/15/2016 17:46   Dg  Shoulder Left  Result Date: 10/16/2016 CLINICAL DATA:  Fall.  Left shoulder pain.  Initial encounter. EXAM: LEFT SHOULDER - 2+ VIEW COMPARISON:  None. FINDINGS: There is no evidence of fracture or dislocation. There is no evidence of arthropathy or other focal bone abnormality. Soft tissues are unremarkable. IMPRESSION: Negative. Electronically Signed   By: Sebastian Ache M.D.   On: 10/16/2016 10:55    EKG:   Orders placed or performed during the hospital encounter of 10/15/16  . EKG 12-Lead  . EKG 12-Lead  . ED EKG within 10 minutes  . ED EKG within 10 minutes      Management plans discussed with the patient, family and they  are in agreement.  CODE STATUS:     Code Status Orders        Start     Ordered   10/15/16 2211  Full code  Continuous     10/15/16 2210    Code Status History    Date Active Date Inactive Code Status Order ID Comments User Context   01/10/2016  5:17 PM 01/12/2016  8:07 PM Full Code [id number]  Houston Siren, MD Inpatient      TOTAL TIME TAKING CARE OF THIS PATIENT: 40 minutes.    [name] M.D on 10/17/2016 at 3:35 PM  Between 7am to 6pm - Pager - [phone]  After 6pm go to [email or web address] - password [email or web address] [facility]  [facility]  Office  [phone]  CC: Primary care physician; Leanna Sato, MD

## 2017-07-14 ENCOUNTER — Other Ambulatory Visit: Payer: Self-pay

## 2017-07-14 ENCOUNTER — Emergency Department: Payer: Medicare HMO

## 2017-07-14 ENCOUNTER — Encounter: Payer: Self-pay | Admitting: *Deleted

## 2017-07-14 ENCOUNTER — Emergency Department
Admission: EM | Admit: 2017-07-14 | Discharge: 2017-07-14 | Disposition: A | Discharge status: Home / Self Care | Payer: Medicare HMO | Attending: Emergency Medicine | Admitting: Emergency Medicine

## 2017-07-14 DIAGNOSIS — Z7982 Long term (current) use of aspirin: Secondary | ICD-10-CM | POA: Diagnosis not present

## 2017-07-14 DIAGNOSIS — I130 Hypertensive heart and chronic kidney disease with heart failure and stage 1 through stage 4 chronic kidney disease, or unspecified chronic kidney disease: Secondary | ICD-10-CM | POA: Insufficient documentation

## 2017-07-14 DIAGNOSIS — I509 Heart failure, unspecified: Secondary | ICD-10-CM | POA: Insufficient documentation

## 2017-07-14 DIAGNOSIS — Z794 Long term (current) use of insulin: Secondary | ICD-10-CM | POA: Diagnosis not present

## 2017-07-14 DIAGNOSIS — N183 Chronic kidney disease, stage 3 (moderate): Secondary | ICD-10-CM | POA: Diagnosis not present

## 2017-07-14 DIAGNOSIS — R1013 Epigastric pain: Secondary | ICD-10-CM | POA: Diagnosis present

## 2017-07-14 DIAGNOSIS — Z79899 Other long term (current) drug therapy: Secondary | ICD-10-CM | POA: Insufficient documentation

## 2017-07-14 DIAGNOSIS — J449 Chronic obstructive pulmonary disease, unspecified: Secondary | ICD-10-CM | POA: Diagnosis not present

## 2017-07-14 DIAGNOSIS — R079 Chest pain, unspecified: Secondary | ICD-10-CM

## 2017-07-14 DIAGNOSIS — E1122 Type 2 diabetes mellitus with diabetic chronic kidney disease: Secondary | ICD-10-CM | POA: Insufficient documentation

## 2017-07-14 DIAGNOSIS — Z7902 Long term (current) use of antithrombotics/antiplatelets: Secondary | ICD-10-CM | POA: Diagnosis not present

## 2017-07-14 DIAGNOSIS — Z7722 Contact with and (suspected) exposure to environmental tobacco smoke (acute) (chronic): Secondary | ICD-10-CM | POA: Diagnosis not present

## 2017-07-14 DIAGNOSIS — Z955 Presence of coronary angioplasty implant and graft: Secondary | ICD-10-CM | POA: Diagnosis not present

## 2017-07-14 HISTORY — DX: Heart failure, unspecified: I50.9

## 2017-07-14 LAB — BASIC METABOLIC PANEL
Anion gap: 13 (ref 5–15)
BUN: 28 mg/dL — ABNORMAL HIGH (ref 6–20)
CALCIUM: 9 mg/dL (ref 8.9–10.3)
CO2: 22 mmol/L (ref 22–32)
CREATININE: 1.19 mg/dL — AB (ref 0.44–1.00)
Chloride: 101 mmol/L (ref 101–111)
GFR calc non Af Amer: 42 mL/min — ABNORMAL LOW (ref >60)
GFR, EST AFRICAN AMERICAN: 48 mL/min — AB (ref >60)
Glucose, Bld: 197 mg/dL — ABNORMAL HIGH (ref 65–99)
Potassium: 3.7 mmol/L (ref 3.5–5.1)
SODIUM: 136 mmol/L (ref 135–145)

## 2017-07-14 LAB — CBC
HCT: 38.4 % (ref 35.0–47.0)
Hemoglobin: 12.3 g/dL (ref 12.0–16.0)
MCH: 25.9 pg — ABNORMAL LOW (ref 26.0–34.0)
MCHC: 32.1 g/dL (ref 32.0–36.0)
MCV: 80.6 fL (ref 80.0–100.0)
PLATELETS: 271 10*3/uL (ref 150–440)
RBC: 4.76 MIL/uL (ref 3.80–5.20)
RDW: 15.9 % — ABNORMAL HIGH (ref 11.5–14.5)
WBC: 6.6 10*3/uL (ref 3.6–11.0)

## 2017-07-14 LAB — HEPATIC FUNCTION PANEL
ALBUMIN: 3.5 g/dL (ref 3.5–5.0)
ALT: 18 U/L (ref 14–54)
AST: 22 U/L (ref 15–41)
Alkaline Phosphatase: 100 U/L (ref 38–126)
Bilirubin, Direct: <0.1 mg/dL — ABNORMAL LOW (ref 0.1–0.5)
Total Bilirubin: 0.6 mg/dL (ref 0.3–1.2)
Total Protein: 7.1 g/dL (ref 6.5–8.1)

## 2017-07-14 LAB — TROPONIN I

## 2017-07-14 MED ORDER — GI COCKTAIL ~~LOC~~
30.0000 mL | Freq: Once | ORAL | Status: AC
  Administered 2017-07-14: 30 mL via ORAL
  Filled 2017-07-14: qty 30

## 2017-07-14 MED ORDER — PANTOPRAZOLE SODIUM 20 MG PO TBEC: 20.0000 mg | DELAYED_RELEASE_TABLET | Freq: Every day | ORAL | 1 refills | Status: DC

## 2017-07-14 NOTE — Discharge Instructions (Signed)
take the Protonix 1 pill a day. This may help with your pain. Please follow-up with your cardiologist in the next day or 2. Tell them you in the ER with some chest pain. They should go to see rapidly. Please return here if you get worse.

## 2017-07-14 NOTE — ED Notes (Signed)
Pt assisted to bathroom to urinate 

## 2017-07-14 NOTE — ED Notes (Signed)
ED Provider at bedside. 

## 2017-07-14 NOTE — ED Notes (Signed)
Pt reports sob.  No chest pain.  Pt is deaf.  Daughter is with pt.  Pt is on home oxygen at home. Marland Kitchen. Hx copd.  Nonsmoker.  nsr on monitor.  Pt denies n/v/d.  Pt also reports intermittent abd pain.  No abd pain now.

## 2017-07-14 NOTE — ED Provider Notes (Signed)
[facility] Emergency Department Provider Note   ____________________________________________   First MD Initiated Contact with Patient 07/14/17 1827     (approximate)  I have reviewed the triage vital signs and the nursing notes.   HISTORY  Chief Complaint Chest Pain    HPI Connie Wilkins is a 84 y.o. female Patient complains of pain in the epigastric area constantly since yesterday it's achy and tight feeling. Seems to get worse if she eats perhaps.patient's family reports she's had her gallbladder out. Seems to hurt in that area when she breathes and it also hurts to push on her xiphoid process.   Past Medical History:  Diagnosis Date  . CHF (congestive heart failure) (HCC)   . COPD (chronic obstructive pulmonary disease) (HCC)   . Diabetes mellitus without complication (HCC)   . MI, old     Patient Active Problem List   Diagnosis Date Noted  . Musculoskeletal chest pain 10/17/2016  . Essential hypertension 10/17/2016  . CKD (chronic kidney disease), stage III (HCC) 10/17/2016  . Dyspnea 10/17/2016  . Diabetes mellitus type 2 in obese (HCC) 10/17/2016  . COPD with acute exacerbation (HCC) 10/15/2016  . COPD exacerbation (HCC) 01/10/2016    Past Surgical History:  Procedure Laterality Date  . CARDIAC SURGERY    . CORONARY STENT PLACEMENT      Prior to Admission medications   Medication Sig Start Date End Date Taking? Authorizing Provider  amLODipine (NORVASC) 10 MG tablet Take 1 tablet (10 mg total) by mouth daily. 10/18/16   [name], [name], MD  aspirin EC 81 MG tablet Take 81 mg by mouth daily.    [provider]  atorvastatin (LIPITOR) 80 MG tablet Take 1 tablet by mouth daily at 6 PM. 12/16/15   [provider]  azithromycin (ZITHROMAX) 250 MG tablet Take as directed Patient not taking: Reported on 10/15/2016 01/13/16   Enedina FinnerPatel, Sona, MD  budesonide-formoterol [facility](SYMBICORT) 160-4.5 MCG/ACT inhaler Inhale 2 puffs into  the lungs 2 (two) times daily. 10/17/16   [name], [name], MD  clopidogrel (PLAVIX) 75 MG tablet Take 1 tablet by mouth daily. 12/16/15   [provider]  furosemide (LASIX) 20 MG tablet Take 1 tablet by mouth daily. 12/25/15   [provider]  gabapentin (NEURONTIN) 600 MG tablet Take 1 tablet by mouth at bedtime.  11/06/15   [provider]  GLIPIZIDE XL 5 MG 24 hr tablet Take 1 tablet by mouth daily. 12/16/15   [provider]  hydrALAZINE (APRESOLINE) 50 MG tablet Take 1 tablet by mouth 2 (two) times daily. 06/17/17   [provider]  insulin aspart (NOVOLOG) 100 UNIT/ML injection Inject 15 Units into the skin 2 (two) times daily with a meal. Takes with Lunch and Therapist, artDinner    [provider]  Insulin Detemir (LEVEMIR FLEXPEN) 100 UNIT/ML Pen Inject 40 Units into the skin daily at 10 pm.    [provider]  ipratropium-albuterol (DUONEB) 0.5-2.5 (3) MG/3ML SOLN Take 3 mLs by nebulization every 4 (four) hours. 10/17/16   [name], [name], MD  isosorbide mononitrate (IMDUR) 60 MG 24 hr tablet Take 1 tablet by mouth at bedtime.  12/21/15   [provider]  levothyroxine (SYNTHROID, LEVOTHROID) 25 MCG tablet Take 1 tablet by mouth daily. 12/25/15   [provider]  lisinopril (PRINIVIL,ZESTRIL) 40 MG tablet Take 1 tablet by mouth daily. 12/25/15   [provider]  metoprolol tartrate (LOPRESSOR) 25 MG tablet Take 75 mg by mouth every  morning.  01/08/16   [provider]  pantoprazole (PROTONIX) 20 MG tablet Take 1 tablet (20 mg total) by mouth daily. 07/14/17 07/14/18  [name], MD  predniSONE (DELTASONE) 10 MG tablet Take 1 tablet (10 mg total) by mouth daily with breakfast. Please take 6 pills in the morning on the day 1, then taper by one pill daily until finished, thank you 10/17/16   [name], MD  traMADol (ULTRAM) 50 MG tablet Take 1 tablet by mouth every 8 (eight) hours as needed. 07/09/17   [provider]  trolamine salicylate (ASPERCREME) 10 % cream Apply topically as needed for muscle pain (Left shoulder, upper arm. Musculoskeletal pains). 10/17/16   [name], MD    Allergies Patient has no known allergies.  Family History  Problem Relation Age of Onset  . Breast cancer Mother   . COPD Father     Social History Social History   Tobacco Use  . Smoking status: Passive Smoke Exposure - Never Smoker  . Smokeless tobacco: Never Used  Substance Use Topics  . Alcohol use: No  . Drug use: No    Review of Systems  Constitutional: No fever/chills Eyes: No visual changes. ENT: No sore throat. Cardiovascular:see history of present illness Respiratory: she has some shortness of breath with the chest pain and that it hurts when she breathes. Gastrointestinal: No abdominal pain.  No nausea, no vomiting.  No diarrhea.  No constipation. Genitourinary: Negative for dysuria. Musculoskeletal: Negative for back pain. Skin: Negative for rash. Neurological: Negative for headaches, focal weakness   ____________________________________________   PHYSICAL EXAM:  VITAL SIGNS: ED Triage Vitals  Enc Vitals Group     BP 07/14/17 1613 (!) 162/84     Pulse Rate 07/14/17 1613 66     Resp 07/14/17 1613 (!) 22     Temp 07/14/17 1613 98.6 F (37 C)     Temp Source 07/14/17 1613 Oral     SpO2 07/14/17 1613 98 %     Weight 07/14/17 1618 238 lb (108 kg)     Height 07/14/17 1618 5\' 3"  (1.6 m)     Head Circumference --      Peak Flow --      Pain Score --      Pain Loc --      Pain Edu? --      Excl. in GC? --     Constitutional: Alert and oriented. Well appearing and in no acute distress. Eyes: Conjunctivae are normal. I. Head: Atraumatic. Nose: No congestion/rhinnorhea. Mouth/Throat: Mucous membranes are moist.  Oropharynx non-erythematous. Neck: No stridor. \ Cardiovascular: Normal rate, regular rhythm. Grossly normal heart sounds.  Good peripheral  circulation. Respiratory: Normal respiratory effort.  No retractions. Lungs CTAB.palpation of the xiphoid and immediately under exactly reproduces her pain. Gastrointestinal: Soft and nontender. No distention. No abdominal bruits. No CVA tenderness. Musculoskeletal: No lower extremity tenderness nor edema.   Neurologic:. No new gross focal neurologic deficits are appreciated.  Skin:  Skin is warm, dry and intact. No rash noted.   ____________________________________________   LABS (all labs ordered are listed, but only abnormal results are displayed)  Labs Reviewed  BASIC METABOLIC PANEL - Abnormal; Notable for the following components:      Result Value   Glucose, Bld 197 (*)    BUN 28 (*)    Creatinine, Ser 1.19 (*)    GFR calc non Af Amer 42 (*)    GFR calc Af Amer 48 (*)  All other components within normal limits  CBC - Abnormal; Notable for the following components:   MCH 25.9 (*)    RDW 15.9 (*)    All other components within normal limits  TROPONIN I  TROPONIN I  HEPATIC FUNCTION PANEL   ____________________________________________  EKG  EKG read and interpreted by me shows normal sinus rhythm rate of 66 slight left axis no acute ST-T wave changes ____________________________________________  RADIOLOGY  ED MD interpretation: chest x-ray read by radiology as no active cardiopulmonary disease  Official radiology report(s): Dg Chest 2 View  Result Date: 07/14/2017 CLINICAL DATA:  Chest pain.  History of coronary stents. EXAM: CHEST  2 VIEW COMPARISON:  10/15/2016 FINDINGS: There is mild bilateral interstitial prominence. There is no focal consolidation. There is no pleural effusion or pneumothorax. The heart mediastinum are stable. Chronic L1 vertebral body compression fracture. IMPRESSION: No active cardiopulmonary disease. Electronically Signed   By: Elige Ko   On: 07/14/2017 17:23     ____________________________________________   PROCEDURES  Procedure(s) performed:   Procedures  Critical Care performed:   ____________________________________________   INITIAL IMPRESSION / ASSESSMENT AND PLAN / ED COURSE  patient's pain is reproducible by palpation. her pain got much better when she got the GI cocktail. I will try and let her go with some acid blockers.         ____________________________________________   FINAL CLINICAL IMPRESSION(S) / ED DIAGNOSES  Final diagnoses:  Chest pain, unspecified type     ED Discharge Orders        Ordered    pantoprazole (PROTONIX) 20 MG tablet  Daily     07/14/17 1953       Note:  This document was prepared using Dragon voice recognition software and may include unintentional dictation errors.    [name], MD 07/14/17 [phone]

## 2017-07-14 NOTE — ED Triage Notes (Signed)
Pt c/o chest pain starting yesterday. Pt states pain is worsening over time. Pt has prior hx of MI. Pt c/o associated sxs of shortness of breath. Pain is reproduced w/ movement and cough. Pt wears O2 @ home, 2L/. Pt is deaf and is a somewhat poor historian, frequently not responding to questions.

## 2017-07-14 NOTE — ED Notes (Signed)
Pt states feeling better after gi cocktail   md aware.

## 2017-09-06 ENCOUNTER — Other Ambulatory Visit: Payer: Self-pay

## 2017-09-06 ENCOUNTER — Emergency Department: Payer: Medicare HMO

## 2017-09-06 ENCOUNTER — Emergency Department
Admission: EM | Admit: 2017-09-06 | Discharge: 2017-09-06 | Disposition: A | Discharge status: Home / Self Care | Payer: Medicare HMO | Attending: Emergency Medicine | Admitting: Emergency Medicine

## 2017-09-06 DIAGNOSIS — N183 Chronic kidney disease, stage 3 (moderate): Secondary | ICD-10-CM | POA: Diagnosis not present

## 2017-09-06 DIAGNOSIS — S99911A Unspecified injury of right ankle, initial encounter: Secondary | ICD-10-CM | POA: Diagnosis present

## 2017-09-06 DIAGNOSIS — E119 Type 2 diabetes mellitus without complications: Secondary | ICD-10-CM | POA: Diagnosis not present

## 2017-09-06 DIAGNOSIS — Z7902 Long term (current) use of antithrombotics/antiplatelets: Secondary | ICD-10-CM | POA: Insufficient documentation

## 2017-09-06 DIAGNOSIS — Z794 Long term (current) use of insulin: Secondary | ICD-10-CM | POA: Diagnosis not present

## 2017-09-06 DIAGNOSIS — Y999 Unspecified external cause status: Secondary | ICD-10-CM | POA: Diagnosis not present

## 2017-09-06 DIAGNOSIS — Z7722 Contact with and (suspected) exposure to environmental tobacco smoke (acute) (chronic): Secondary | ICD-10-CM | POA: Diagnosis not present

## 2017-09-06 DIAGNOSIS — Z7982 Long term (current) use of aspirin: Secondary | ICD-10-CM | POA: Insufficient documentation

## 2017-09-06 DIAGNOSIS — I509 Heart failure, unspecified: Secondary | ICD-10-CM | POA: Insufficient documentation

## 2017-09-06 DIAGNOSIS — Y92009 Unspecified place in unspecified non-institutional (private) residence as the place of occurrence of the external cause: Secondary | ICD-10-CM

## 2017-09-06 DIAGNOSIS — Y9389 Activity, other specified: Secondary | ICD-10-CM | POA: Diagnosis not present

## 2017-09-06 DIAGNOSIS — S93401A Sprain of unspecified ligament of right ankle, initial encounter: Secondary | ICD-10-CM | POA: Diagnosis not present

## 2017-09-06 DIAGNOSIS — W06XXXA Fall from bed, initial encounter: Secondary | ICD-10-CM | POA: Insufficient documentation

## 2017-09-06 DIAGNOSIS — Y92013 Bedroom of single-family (private) house as the place of occurrence of the external cause: Secondary | ICD-10-CM | POA: Insufficient documentation

## 2017-09-06 DIAGNOSIS — W19XXXA Unspecified fall, initial encounter: Secondary | ICD-10-CM

## 2017-09-06 DIAGNOSIS — S32000S Wedge compression fracture of unspecified lumbar vertebra, sequela: Secondary | ICD-10-CM | POA: Insufficient documentation

## 2017-09-06 DIAGNOSIS — I130 Hypertensive heart and chronic kidney disease with heart failure and stage 1 through stage 4 chronic kidney disease, or unspecified chronic kidney disease: Secondary | ICD-10-CM | POA: Diagnosis not present

## 2017-09-06 HISTORY — DX: Unspecified hearing loss, unspecified ear: H91.90

## 2017-09-06 MED ORDER — TRAMADOL HCL 50 MG PO TABS: 50.0000 mg | ORAL_TABLET | Freq: Two times a day (BID) | ORAL | 0 refills | Status: DC

## 2017-09-06 MED ORDER — TRAMADOL HCL 50 MG PO TABS
50.0000 mg | ORAL_TABLET | Freq: Once | ORAL | Status: AC
  Administered 2017-09-06: 50 mg via ORAL
  Filled 2017-09-06: qty 1

## 2017-09-06 NOTE — Discharge Instructions (Signed)
Your exam and x-rays do not reveal any acute (new) fractures. You may experience some mild to moderate discomfort over the next few days. Take your pain medicine as prescribed. Follow-up with your provider for continued symptoms.

## 2017-09-06 NOTE — ED Provider Notes (Signed)
[facility] Emergency Department Provider Note ____________________________________________  Time seen: 1549  I have reviewed the triage vital signs and the nursing notes.  HISTORY  Chief Complaint  Fall  History limited by hearing impairment. Adult children are present to provide history and ASL interpretation.  HPI Connie Wilkins is a 84 y.o. female presents to the ED accompanied by her son and daughter, for evaluation of injury sustained following a mechanical fall at home.  Patient describes yesterday she accidentally fell from her bed. She denies head injury, LOC, or preceding weakness. She had bilateral TKRs and lumbar surgery and complains primarily of pain the the right ankle, right lower leg, and lower back.   Past Medical History:  Diagnosis Date  . CHF (congestive heart failure) (HCC)   . COPD (chronic obstructive pulmonary disease) (HCC)   . Deaf   . Diabetes mellitus without complication (HCC)   . MI, old     Patient Active Problem List   Diagnosis Date Noted  . Musculoskeletal chest pain 10/17/2016  . Essential hypertension 10/17/2016  . CKD (chronic kidney disease), stage III (HCC) 10/17/2016  . Dyspnea 10/17/2016  . Diabetes mellitus type 2 in obese (HCC) 10/17/2016  . COPD with acute exacerbation (HCC) 10/15/2016  . COPD exacerbation (HCC) 01/10/2016    Past Surgical History:  Procedure Laterality Date  . BACK SURGERY    . CARDIAC SURGERY    . CORONARY STENT PLACEMENT    . REPLACEMENT TOTAL KNEE      Prior to Admission medications   Medication Sig Start Date End Date Taking? Authorizing Provider  amLODipine (NORVASC) 10 MG tablet Take 1 tablet (10 mg total) by mouth daily. 10/18/16   [name], [name], MD  aspirin EC 81 MG tablet Take 81 mg by mouth daily.    [provider]  atorvastatin (LIPITOR) 80 MG tablet Take 1 tablet by mouth daily at 6 PM. 12/16/15   [provider]  azithromycin (ZITHROMAX) 250 MG tablet  Take as directed Patient not taking: Reported on 10/15/2016 01/13/16   Enedina FinnerPatel, Sona, MD  budesonide-formoterol [facility](SYMBICORT) 160-4.5 MCG/ACT inhaler Inhale 2 puffs into the lungs 2 (two) times daily. 10/17/16   [name], [name], MD  clopidogrel (PLAVIX) 75 MG tablet Take 1 tablet by mouth daily. 12/16/15   [provider]  furosemide (LASIX) 20 MG tablet Take 1 tablet by mouth daily. 12/25/15   [provider]  gabapentin (NEURONTIN) 600 MG tablet Take 1 tablet by mouth at bedtime.  11/06/15   [provider]  GLIPIZIDE XL 5 MG 24 hr tablet Take 1 tablet by mouth daily. 12/16/15   [provider]  hydrALAZINE (APRESOLINE) 50 MG tablet Take 1 tablet by mouth 2 (two) times daily. 06/17/17   [provider]  insulin aspart (NOVOLOG) 100 UNIT/ML injection Inject 15 Units into the skin 2 (two) times daily with a meal. Takes with Lunch and Therapist, artDinner    [provider]  Insulin Detemir (LEVEMIR FLEXPEN) 100 UNIT/ML Pen Inject 40 Units into the skin daily at 10 pm.    [provider]  ipratropium-albuterol (DUONEB) 0.5-2.5 (3) MG/3ML SOLN Take 3 mLs by nebulization every 4 (four) hours. 10/17/16   [name], [name], MD  isosorbide mononitrate (IMDUR) 60 MG 24 hr tablet Take 1 tablet by mouth at bedtime.  12/21/15   [provider]  levothyroxine (SYNTHROID, LEVOTHROID) 25 MCG tablet Take 1 tablet by mouth daily. 12/25/15   [provider]  lisinopril (PRINIVIL,ZESTRIL) 40  MG tablet Take 1 tablet by mouth daily. 12/25/15   [provider]  metoprolol tartrate (LOPRESSOR) 25 MG tablet Take 75 mg by mouth every morning.  01/08/16   [provider]  pantoprazole (PROTONIX) 20 MG tablet Take 1 tablet (20 mg total) by mouth daily. 07/14/17 07/14/18  [name], MD  predniSONE (DELTASONE) 10 MG tablet Take 1 tablet (10 mg total) by mouth daily with breakfast. Please take 6 pills in the morning on the day 1, then taper by one pill daily  until finished, thank you 10/17/16   [name], MD  traMADol (ULTRAM) 50 MG tablet Take 1 tablet by mouth every 8 (eight) hours as needed. 07/09/17   [provider]  traMADol (ULTRAM) 50 MG tablet Take 1 tablet (50 mg total) by mouth 2 (two) times daily. 09/06/17   Parthena Fergeson, Charlesetta Ivory, PA-C  trolamine salicylate (ASPERCREME) 10 % cream Apply topically as needed for muscle pain (Left shoulder, upper arm. Musculoskeletal pains). 10/17/16   [name], MD    Allergies Patient has no known allergies.  Family History  Problem Relation Age of Onset  . Breast cancer Mother   . COPD Father     Social History Social History   Tobacco Use  . Smoking status: Passive Smoke Exposure - Never Smoker  . Smokeless tobacco: Never Used  Substance Use Topics  . Alcohol use: No  . Drug use: No    Review of Systems  Constitutional: Negative for fever. Eyes: Negative for visual changes. ENT: Negative for sore throat. Cardiovascular: Negative for chest pain. Respiratory: Negative for shortness of breath. Gastrointestinal: Negative for abdominal pain, vomiting and diarrhea. Musculoskeletal: Positive for low back, ankle, hip, and lower leg pain. Skin: Negative for rash. Neurological: Negative for headaches, focal weakness or numbness. ____________________________________________  PHYSICAL EXAM:  VITAL SIGNS: ED Triage Vitals  Enc Vitals Group     BP 09/06/17 1432 127/79     Pulse Rate 09/06/17 1432 63     Resp 09/06/17 1432 20     Temp 09/06/17 1432 97.8 F (36.6 C)     Temp Source 09/06/17 1432 Oral     SpO2 09/06/17 1432 92 %     Weight 09/06/17 1435 230 lb (104.3 kg)     Height 09/06/17 1435 5\' 3"  (1.6 m)     Head Circumference --      Peak Flow --      Pain Score 09/06/17 1433 10     Pain Loc --      Pain Edu? --      Excl. in GC? --     Constitutional: Alert and oriented. Well appearing and in no distress. Head: Normocephalic and atraumatic. Eyes:  Conjunctivae are normal. Normal extraocular movements Neck: Supple. No thyromegaly. Cardiovascular: Normal rate, regular rhythm. Normal distal pulses. Respiratory: Normal respiratory effort. No wheezes/rales/rhonchi. Gastrointestinal: Soft and nontender. No distention. Musculoskeletal: Normal spinal alignment without midline tenderness, spasm, deformity, or step-off.  Patient is mildly tender to palpation over the sacrum and coccyx region.  Right ankle with moderate lateral soft tissue swelling and early ecchymosis noted.  Patient is tender to palpation of the lateral ankle.  She has some mild tenderness to palpation to the right calf musculature.  No defect is appreciated.  The knee exam is benign on the right.  There is some discomfort with straight leg raise on the right, localized to the hip and pelvic region.  Nontender with normal range of motion in  all extremities.  Neurologic:  Normal speech and language. No gross focal neurologic deficits are appreciated. Skin:  Skin is warm, dry and intact. No rash noted. Psychiatric: Mood and affect are normal. Patient exhibits appropriate insight and judgment. ____________________________________________   RADIOLOGY  Right Ankle Negative   Right Tib/Fib IMPRESSION: 1. No fracture or acute finding. 2. No evidence of loosening of the knee prosthetic components. 3. Subtle lucencies in the mid tibia. Recommend correlation within the previous history of screw placement in the mid tibia. If there has been no such history, small lytic lesions are possible such as myeloma.  Right Hip w/ Pelvis IMPRESSION: No fracture or hip joint abnormality.  Lumbar Spine IMPRESSION: 1. Moderate compression fracture of L1, increased in severity when compared to the prior radiographs. An acute component of this fracture is possible, which could be further assessed if desired clinically with lumbar MRI. 2. No other fractures. 3. Degenerative changes as  described. ____________________________________________  PROCEDURES  Procedures Ultram 50 mg PO Ace wrap - Right Ankle  ____________________________________________  INITIAL IMPRESSION / ASSESSMENT AND PLAN / ED COURSE  Geriatric patient with ED evaluation of a mechanical fall at home.  Patient was evaluated by x-ray.  Her exam is overall benign and there is no acute findings on her films.  Patient's primary complaint was right ankle and low back pain.  Her lumbar film showed some progression of her previous L1 compression fracture.  Patient symptoms related to that area.  She is discharged with instructions to follow-up with her primary provider for ongoing symptoms.  A prescription for Ultram (#10) was provided for acute pain relief. ____________________________________________  FINAL CLINICAL IMPRESSION(S) / ED DIAGNOSES  Final diagnoses:  Fall in home, initial encounter  Sprain of right ankle, unspecified ligament, initial encounter  Lumbar compression fracture, sequela      Karmen Stabs, Charlesetta Ivory, PA-C 09/06/17 1808    Jeanmarie Plant, MD 09/06/17 2127

## 2017-09-06 NOTE — ED Triage Notes (Signed)
Pt is deaf. Son in triage. Able to communicate with her. States fell out of bed yesterday, unwitnessed. C/o back pain and R leg pain. States has had knee replacement and worried about her "artificial knee." used bathroom with son in triage. In wheelchair. Denies blood thinner use. Denies LOC, son states she "screamed and hollered." uses walker normally.

## 2018-08-29 ENCOUNTER — Other Ambulatory Visit: Payer: Self-pay

## 2018-08-29 ENCOUNTER — Emergency Department: Payer: Medicare HMO

## 2018-08-29 ENCOUNTER — Encounter: Payer: Self-pay | Admitting: Radiology

## 2018-08-29 ENCOUNTER — Emergency Department
Admission: EM | Admit: 2018-08-29 | Discharge: 2018-08-29 | Disposition: A | Discharge status: Home / Self Care | Payer: Medicare HMO | Attending: Emergency Medicine | Admitting: Emergency Medicine

## 2018-08-29 DIAGNOSIS — Z7722 Contact with and (suspected) exposure to environmental tobacco smoke (acute) (chronic): Secondary | ICD-10-CM | POA: Diagnosis not present

## 2018-08-29 DIAGNOSIS — M542 Cervicalgia: Secondary | ICD-10-CM

## 2018-08-29 DIAGNOSIS — R059 Cough, unspecified: Secondary | ICD-10-CM

## 2018-08-29 DIAGNOSIS — Z9981 Dependence on supplemental oxygen: Secondary | ICD-10-CM | POA: Insufficient documentation

## 2018-08-29 DIAGNOSIS — I129 Hypertensive chronic kidney disease with stage 1 through stage 4 chronic kidney disease, or unspecified chronic kidney disease: Secondary | ICD-10-CM | POA: Diagnosis not present

## 2018-08-29 DIAGNOSIS — Z79899 Other long term (current) drug therapy: Secondary | ICD-10-CM | POA: Insufficient documentation

## 2018-08-29 DIAGNOSIS — N183 Chronic kidney disease, stage 3 (moderate): Secondary | ICD-10-CM | POA: Diagnosis not present

## 2018-08-29 DIAGNOSIS — Z794 Long term (current) use of insulin: Secondary | ICD-10-CM | POA: Insufficient documentation

## 2018-08-29 DIAGNOSIS — Z96659 Presence of unspecified artificial knee joint: Secondary | ICD-10-CM | POA: Insufficient documentation

## 2018-08-29 DIAGNOSIS — J449 Chronic obstructive pulmonary disease, unspecified: Secondary | ICD-10-CM | POA: Insufficient documentation

## 2018-08-29 DIAGNOSIS — Z955 Presence of coronary angioplasty implant and graft: Secondary | ICD-10-CM | POA: Diagnosis not present

## 2018-08-29 DIAGNOSIS — R05 Cough: Secondary | ICD-10-CM

## 2018-08-29 DIAGNOSIS — E1122 Type 2 diabetes mellitus with diabetic chronic kidney disease: Secondary | ICD-10-CM | POA: Insufficient documentation

## 2018-08-29 DIAGNOSIS — Z7982 Long term (current) use of aspirin: Secondary | ICD-10-CM | POA: Diagnosis not present

## 2018-08-29 DIAGNOSIS — R070 Pain in throat: Secondary | ICD-10-CM | POA: Insufficient documentation

## 2018-08-29 LAB — CBC
HCT: 37.5 % (ref 36.0–46.0)
HEMOGLOBIN: 11.5 g/dL — AB (ref 12.0–15.0)
MCH: 27.6 pg (ref 26.0–34.0)
MCHC: 30.7 g/dL (ref 30.0–36.0)
MCV: 89.9 fL (ref 80.0–100.0)
Platelets: 180 10*3/uL (ref 150–400)
RBC: 4.17 MIL/uL (ref 3.87–5.11)
RDW: 14.6 % (ref 11.5–15.5)
WBC: 5.9 10*3/uL (ref 4.0–10.5)
nRBC: 0 % (ref 0.0–0.2)

## 2018-08-29 LAB — COMPREHENSIVE METABOLIC PANEL
ALBUMIN: 3.2 g/dL — AB (ref 3.5–5.0)
ALT: 11 U/L (ref 0–44)
ANION GAP: 6 (ref 5–15)
AST: 17 U/L (ref 15–41)
Alkaline Phosphatase: 79 U/L (ref 38–126)
BUN: 26 mg/dL — ABNORMAL HIGH (ref 8–23)
CHLORIDE: 108 mmol/L (ref 98–111)
CO2: 26 mmol/L (ref 22–32)
Calcium: 8.2 mg/dL — ABNORMAL LOW (ref 8.9–10.3)
Creatinine, Ser: 1.5 mg/dL — ABNORMAL HIGH (ref 0.44–1.00)
GFR calc Af Amer: 37 mL/min — ABNORMAL LOW (ref >60)
GFR calc non Af Amer: 32 mL/min — ABNORMAL LOW (ref >60)
GLUCOSE: 233 mg/dL — AB (ref 70–99)
Potassium: 3.9 mmol/L (ref 3.5–5.1)
SODIUM: 140 mmol/L (ref 135–145)
Total Bilirubin: 0.5 mg/dL (ref 0.3–1.2)
Total Protein: 6.2 g/dL — ABNORMAL LOW (ref 6.5–8.1)

## 2018-08-29 MED ORDER — IOHEXOL 300 MG/ML  SOLN
60.0000 mL | Freq: Once | INTRAMUSCULAR | Status: AC | PRN
  Administered 2018-08-29: 60 mL via INTRAVENOUS

## 2018-08-29 MED ORDER — LIDOCAINE VISCOUS HCL 2 % MT SOLN: 10.0000 mL | OROMUCOSAL | 0 refills | Status: AC | PRN

## 2018-08-29 NOTE — ED Provider Notes (Signed)
[facility] Emergency Department Provider Note  ____________________________________________  Time seen: Approximately 1:57 PM  I have reviewed the triage vital signs and the nursing notes.   HISTORY  Chief Complaint Facial Pain    HPI Connie Wilkins is a 85 y.o. female that presents to the emergency department for right sided neck pain, worsening this morning. Right side of neck feels swollen and her throat feels raw. Pain is worse with swallowing. Patient has been treated with two rounds of antibiotics, does not recall their names, and finished last dose this morning. She has a chronic cough that has not changed. She was coughing up blood 1 week ago but none since. She wears oxygen at night. No fever, headache, dizziness, nasal congestion, facial pain, SOB, CP, vomiting.     Past Medical History:  Diagnosis Date  . CHF (congestive heart failure) (HCC)   . COPD (chronic obstructive pulmonary disease) (HCC)   . Deaf   . Diabetes mellitus without complication (HCC)   . MI, old     Patient Active Problem List   Diagnosis Date Noted  . Musculoskeletal chest pain 10/17/2016  . Essential hypertension 10/17/2016  . CKD (chronic kidney disease), stage III (HCC) 10/17/2016  . Dyspnea 10/17/2016  . Diabetes mellitus type 2 in obese (HCC) 10/17/2016  . COPD with acute exacerbation (HCC) 10/15/2016  . COPD exacerbation (HCC) 01/10/2016    Past Surgical History:  Procedure Laterality Date  . BACK SURGERY    . CARDIAC SURGERY    . CORONARY STENT PLACEMENT    . REPLACEMENT TOTAL KNEE      Prior to Admission medications   Medication Sig Start Date End Date Taking? Authorizing Provider  amLODipine (NORVASC) 10 MG tablet Take 1 tablet (10 mg total) by mouth daily. 10/18/16   [name], MD  aspirin EC 81 MG tablet Take 81 mg by mouth daily.    [provider]  atorvastatin (LIPITOR) 80 MG tablet Take 1 tablet by mouth daily at 6 PM. 12/16/15    [provider]  azithromycin (ZITHROMAX) 250 MG tablet Take as directed Patient not taking: Reported on 10/15/2016 01/13/16   Enedina Finner, MD  budesonide-formoterol [facility]) 160-4.5 MCG/ACT inhaler Inhale 2 puffs into the lungs 2 (two) times daily. 10/17/16   [name], MD  clopidogrel (PLAVIX) 75 MG tablet Take 1 tablet by mouth daily. 12/16/15   [provider]  furosemide (LASIX) 20 MG tablet Take 1 tablet by mouth daily. 12/25/15   [provider]  gabapentin (NEURONTIN) 600 MG tablet Take 1 tablet by mouth at bedtime.  11/06/15   [provider]  GLIPIZIDE XL 5 MG 24 hr tablet Take 1 tablet by mouth daily. 12/16/15   [provider]  hydrALAZINE (APRESOLINE) 50 MG tablet Take 1 tablet by mouth 2 (two) times daily. 06/17/17   [provider]  insulin aspart (NOVOLOG) 100 UNIT/ML injection Inject 15 Units into the skin 2 (two) times daily with a meal. Takes with Lunch and Therapist, art, Historical, MD  Insulin Detemir (LEVEMIR FLEXPEN) 100 UNIT/ML Pen Inject 40 Units into the skin daily at 10 pm.    [provider]  ipratropium-albuterol (DUONEB) 0.5-2.5 (3) MG/3ML SOLN Take 3 mLs by nebulization every 4 (four) hours. 10/17/16   [name], MD  isosorbide mononitrate (IMDUR) 60 MG 24 hr tablet Take 1 tablet by mouth at bedtime.  12/21/15   [provider]  levothyroxine (SYNTHROID, LEVOTHROID) 25 MCG  tablet Take 1 tablet by mouth daily. 12/25/15   [provider]  lidocaine (XYLOCAINE) 2 % solution Use as directed 10 mLs in the mouth or throat as needed. 08/29/18   Enid Derry, PA-C  lisinopril (PRINIVIL,ZESTRIL) 40 MG tablet Take 1 tablet by mouth daily. 12/25/15   [provider]  metoprolol tartrate (LOPRESSOR) 25 MG tablet Take 75 mg by mouth every morning.  01/08/16   [provider]  pantoprazole (PROTONIX) 20 MG tablet Take 1 tablet (20 mg total) by mouth daily. 07/14/17 07/14/18  [name], MD  predniSONE (DELTASONE) 10 MG tablet Take 1 tablet (10 mg total) by mouth daily with breakfast. Please take 6 pills in the morning on the day 1, then taper by one pill daily until finished, thank you 10/17/16   [name], MD  traMADol (ULTRAM) 50 MG tablet Take 1 tablet by mouth every 8 (eight) hours as needed. 07/09/17   [provider]  traMADol (ULTRAM) 50 MG tablet Take 1 tablet (50 mg total) by mouth 2 (two) times daily. 09/06/17   Menshew, Charlesetta Ivory, PA-C  trolamine salicylate (ASPERCREME) 10 % cream Apply topically as needed for muscle pain (Left shoulder, upper arm. Musculoskeletal pains). 10/17/16   [name], MD    Allergies Patient has no known allergies.  Family History  Problem Relation Age of Onset  . Breast cancer Mother   . COPD Father     Social History Social History   Tobacco Use  . Smoking status: Passive Smoke Exposure - Never Smoker  . Smokeless tobacco: Never Used  Substance Use Topics  . Alcohol use: No  . Drug use: No     Review of Systems  Constitutional: No fever/chills ENT: No upper respiratory complaints. Cardiovascular: No chest pain. Respiratory: No cough. No SOB. Gastrointestinal: No abdominal pain.  No nausea, no vomiting.  Musculoskeletal: Negative for musculoskeletal pain. Skin: Negative for rash, abrasions, lacerations, ecchymosis. Neurological: Negative for headaches, numbness or tingling   ____________________________________________   PHYSICAL EXAM:  VITAL SIGNS: ED Triage Vitals [08/29/18 1354]  Enc Vitals Group     BP      Pulse      Resp      Temp      Temp src      SpO2      Weight 210 lb (95.3 kg)     Height      Head Circumference      Peak Flow      Pain Score 9     Pain Loc      Pain Edu?      Excl. in GC?      Constitutional: Alert and oriented. Well appearing and in no acute distress. Eyes: Conjunctivae are normal. PERRL. EOMI. Head: Atraumatic. ENT:      Ears:      Nose:  No congestion/rhinnorhea.      Mouth/Throat: Mucous membranes are moist.  Oropharynx nonerythematous.  Tonsils not enlarged.  Uvula midline. Neck: No stridor.  No cervical spine tenderness to palpation. Mild tenderness to palpation to right neck, just posterior to right ear. Full ROM of neck. No visible swelling. No cervical lymphadenopathy. No erythema.  Cardiovascular: Normal rate, regular rhythm.  Good peripheral circulation. Respiratory: Normal respiratory effort without tachypnea or retractions. Lungs CTAB. Good air entry to the bases with no decreased or absent breath sounds. Gastrointestinal: Bowel sounds 4 quadrants. Soft and nontender to palpation. No guarding or rigidity. No palpable masses. No  distention.  Musculoskeletal: Full range of motion to all extremities. No gross deformities appreciated.  Neurologic:  Normal speech and language. No gross focal neurologic deficits are appreciated.  Skin:  Skin is warm, dry and intact. No rash noted. Psychiatric: Mood and affect are normal. Speech and behavior are normal. Patient exhibits appropriate insight and judgement.   ____________________________________________   LABS (all labs ordered are listed, but only abnormal results are displayed)  Labs Reviewed  CBC - Abnormal; Notable for the following components:      Result Value   Hemoglobin 11.5 (*)    All other components within normal limits  COMPREHENSIVE METABOLIC PANEL - Abnormal; Notable for the following components:   Glucose, Bld 233 (*)    BUN 26 (*)    Creatinine, Ser 1.50 (*)    Calcium 8.2 (*)    Total Protein 6.2 (*)    Albumin 3.2 (*)    GFR calc non Af Amer 32 (*)    GFR calc Af Amer 37 (*)    All other components within normal limits   ____________________________________________  EKG   ____________________________________________  RADIOLOGY   Ct Soft Tissue Neck W Contrast  Result Date: 08/29/2018 CLINICAL DATA:  Pain with swallowing.  Complains of  headache. EXAM: CT NECK WITH CONTRAST TECHNIQUE: Multidetector CT imaging of the neck was performed using the standard protocol following the bolus administration of intravenous contrast. CONTRAST:  28mL OMNIPAQUE IOHEXOL 300 MG/ML  SOLN COMPARISON:  None. FINDINGS: Pharynx and larynx: Normal. No mass or swelling. Dolichoectatic internal carotid arteries, more so on the RIGHT, cause posterior pharyngeal indentation, normal variant. Salivary glands: No inflammation, mass, or stone. Thyroid: Normal. Lymph nodes: None enlarged or abnormal density. Vascular: Mild atherosclerosis at the carotid bifurcations, as well as the skull base segments of the internal carotid and vertebral arteries. Dolichoectasia. Limited intracranial: Negative. Visualized orbits: Negative. Mastoids and visualized paranasal sinuses: Status post RIGHT mastoidectomy. No residual inflammation. BILATERAL external canal cerumen. Skeleton: Moderately advanced cervical spondylosis, with disc space narrowing most pronounced at C5-6 and C6-7. No fracture or destructive lesion. Upper chest: Respiratory degradation.  No mass or pneumothorax. Other: None. IMPRESSION: No evidence of tonsillitis, or other pharyngeal inflammatory process. No significant sinus opacity or layering sinus fluid. Cervical spondylosis. 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 Unremarkable appearance status post RIGHT mastoidectomy. Electronically Signed   By: Elsie Stain M.D.   On: 08/29/2018 15:26   Dg Chest Port 1 View  Result Date: 08/29/2018 CLINICAL DATA:  Cough. EXAM: PORTABLE CHEST 1 VIEW COMPARISON:  July 14, 2017 FINDINGS: The heart size and mediastinal contours are within normal limits. Both lungs are clear. The visualized skeletal structures are unremarkable. IMPRESSION: No active disease. Electronically Signed   By: Gerome Sam III M.D   On: 08/29/2018 14:52     ____________________________________________    PROCEDURES  Procedure(s) performed:    Procedures    Medications  iohexol (OMNIPAQUE) 300 MG/ML solution 60 mL (60 mLs Intravenous Contrast Given 08/29/18 1502)     ____________________________________________   INITIAL IMPRESSION / ASSESSMENT AND PLAN / ED COURSE  Pertinent labs & imaging results that were available during my care of the patient were reviewed by me and considered in my medical decision making (see chart for details).  Review of the [place] CSRS was performed in accordance of the [facility] prior to dispensing any controlled drugs.  Patient presented to the emergency department for evaluation of neck pain for 3 weeks.  DDx includes but not limited to mass, abscess, referred dental pain/ear pain, muscle strain.  Vital signs are reassuring.  No increased WBC. CT negative for acute processes. No indication of infectious etiology.   Patient will be discharged home with prescriptions for viscous lidocaine. Patient is to follow up with ENT as directed. Patient is given ED precautions to return to the ED for any worsening or new symptoms.     ____________________________________________  FINAL CLINICAL IMPRESSION(S) / ED DIAGNOSES  Final diagnoses:  Neck pain      NEW MEDICATIONS STARTED DURING THIS VISIT:  ED Discharge Orders         Ordered    lidocaine (XYLOCAINE) 2 % solution  As needed     08/29/18 1606              This chart was dictated using voice recognition software/Dragon. Despite best efforts to proofread, errors can occur which can change the meaning. Any change was purely unintentional.    Enid Derry, PA-C 08/29/18 2140    Dionne Bucy, MD 08/30/18 [phone]

## 2018-08-29 NOTE — ED Notes (Signed)
AAOx3.  Skin warm and dry.  NAD 

## 2018-08-29 NOTE — ED Triage Notes (Signed)
Pt presents via POV c/o headache. Treated with PO abx x2 for sinus infection without resolution. Denies recent travel or sick contacts.

## 2018-08-29 NOTE — ED Notes (Signed)
Pt has soiled linen. This EDT and RN Viviann Spare cleaned and changed pt and placed external cath. Pt had no complaints at this time and RN was notified.

## 2018-08-29 NOTE — Discharge Instructions (Signed)
There are no signs of infection on your blood work or CT scan. Please use viscous lidocaine mouthwash for pain. Please call ENT for an appointment for further evaluation.

## 2019-11-14 ENCOUNTER — Other Ambulatory Visit: Payer: Self-pay

## 2019-11-14 ENCOUNTER — Emergency Department: Payer: Medicare HMO

## 2019-11-14 ENCOUNTER — Emergency Department
Admission: EM | Admit: 2019-11-14 | Discharge: 2019-11-14 | Disposition: A | Discharge status: Home / Self Care | Payer: Medicare HMO | Attending: Emergency Medicine | Admitting: Emergency Medicine

## 2019-11-14 DIAGNOSIS — S93402A Sprain of unspecified ligament of left ankle, initial encounter: Secondary | ICD-10-CM | POA: Diagnosis not present

## 2019-11-14 DIAGNOSIS — I509 Heart failure, unspecified: Secondary | ICD-10-CM | POA: Insufficient documentation

## 2019-11-14 DIAGNOSIS — Z7982 Long term (current) use of aspirin: Secondary | ICD-10-CM | POA: Insufficient documentation

## 2019-11-14 DIAGNOSIS — S92302A Fracture of unspecified metatarsal bone(s), left foot, initial encounter for closed fracture: Secondary | ICD-10-CM | POA: Diagnosis not present

## 2019-11-14 DIAGNOSIS — Y929 Unspecified place or not applicable: Secondary | ICD-10-CM | POA: Diagnosis not present

## 2019-11-14 DIAGNOSIS — Y939 Activity, unspecified: Secondary | ICD-10-CM | POA: Insufficient documentation

## 2019-11-14 DIAGNOSIS — I130 Hypertensive heart and chronic kidney disease with heart failure and stage 1 through stage 4 chronic kidney disease, or unspecified chronic kidney disease: Secondary | ICD-10-CM | POA: Insufficient documentation

## 2019-11-14 DIAGNOSIS — S7002XA Contusion of left hip, initial encounter: Secondary | ICD-10-CM | POA: Diagnosis not present

## 2019-11-14 DIAGNOSIS — S99922A Unspecified injury of left foot, initial encounter: Secondary | ICD-10-CM | POA: Diagnosis present

## 2019-11-14 DIAGNOSIS — Z7722 Contact with and (suspected) exposure to environmental tobacco smoke (acute) (chronic): Secondary | ICD-10-CM | POA: Diagnosis not present

## 2019-11-14 DIAGNOSIS — J449 Chronic obstructive pulmonary disease, unspecified: Secondary | ICD-10-CM | POA: Diagnosis not present

## 2019-11-14 DIAGNOSIS — Z794 Long term (current) use of insulin: Secondary | ICD-10-CM | POA: Diagnosis not present

## 2019-11-14 DIAGNOSIS — R52 Pain, unspecified: Secondary | ICD-10-CM

## 2019-11-14 DIAGNOSIS — E1122 Type 2 diabetes mellitus with diabetic chronic kidney disease: Secondary | ICD-10-CM | POA: Diagnosis not present

## 2019-11-14 DIAGNOSIS — S20211A Contusion of right front wall of thorax, initial encounter: Secondary | ICD-10-CM | POA: Diagnosis not present

## 2019-11-14 DIAGNOSIS — Z79899 Other long term (current) drug therapy: Secondary | ICD-10-CM | POA: Insufficient documentation

## 2019-11-14 DIAGNOSIS — N183 Chronic kidney disease, stage 3 unspecified: Secondary | ICD-10-CM | POA: Diagnosis not present

## 2019-11-14 DIAGNOSIS — Y999 Unspecified external cause status: Secondary | ICD-10-CM | POA: Insufficient documentation

## 2019-11-14 DIAGNOSIS — Y92009 Unspecified place in unspecified non-institutional (private) residence as the place of occurrence of the external cause: Secondary | ICD-10-CM

## 2019-11-14 DIAGNOSIS — W010XXA Fall on same level from slipping, tripping and stumbling without subsequent striking against object, initial encounter: Secondary | ICD-10-CM | POA: Insufficient documentation

## 2019-11-14 MED ORDER — TRAMADOL HCL 50 MG PO TABS
50.0000 mg | ORAL_TABLET | Freq: Once | ORAL | Status: AC
  Administered 2019-11-14: 50 mg via ORAL
  Filled 2019-11-14: qty 1

## 2019-11-14 NOTE — Discharge Instructions (Addendum)
Follow-up with Dr. Excell Seltzer who is at [facility] department.  Wear the postop shoe for added support and protection of your fractured toe.  You will need to use your walker each time you are up walking.  You may also elevate and ice both your foot and ankle to reduce swelling and help with pain.  Continue your tramadol which you have at home for pain.  You do have multiple bruising from your fall but no other fractures were noted.  Also follow-up with your primary care provider for recheck of your blood pressure and take blood pressure medication daily.

## 2019-11-14 NOTE — ED Provider Notes (Signed)
[facility] Emergency Department Provider Note   ____________________________________________   First MD Initiated Contact with Patient 11/14/19 1313     (approximate)  I have reviewed the triage vital signs and the nursing notes.   HISTORY  Chief Complaint Ankle Pain Sign language interpreter used on [place]    HPI Connie Wilkins is a 86 y.o. female presents to the ED via EMS from home with complaint of left ankle/foot pain.  Daughter states that patient had a mechanical fall yesterday while in the bathroom.  Patient's daughter was with her and states that there was no head injury or loss of consciousness.  She is continue to have left foot pain and today increased swelling.  She also is unable to bear weight on her left foot.  Patient uses sign language to communicate.      Past Medical History:  Diagnosis Date  . CHF (congestive heart failure) ([place])   . COPD (chronic obstructive pulmonary disease) ([place])   . Deaf   . Diabetes mellitus without complication ([place])   . MI, old     Patient Active Problem List   Diagnosis Date Noted  . Musculoskeletal chest pain 10/17/2016  . Essential hypertension 10/17/2016  . CKD (chronic kidney disease), stage III 10/17/2016  . Dyspnea 10/17/2016  . Diabetes mellitus type 2 in obese ([place]) 10/17/2016  . COPD with acute exacerbation ([place]) 10/15/2016  . COPD exacerbation ([place]) 01/10/2016    Past Surgical History:  Procedure Laterality Date  . BACK SURGERY    . CARDIAC SURGERY    . CORONARY STENT PLACEMENT    . REPLACEMENT TOTAL KNEE      Prior to Admission medications   Medication Sig Start Date End Date Taking? Authorizing Provider  amLODipine (NORVASC) 10 MG tablet Take 1 tablet (10 mg total) by mouth daily. 10/18/16   Theodoro Grist, MD  aspirin EC 81 MG tablet Take 81 mg by mouth daily.    [provider]  atorvastatin (LIPITOR) 80 MG tablet Take 1 tablet by mouth daily at 6 PM. 12/16/15    [provider]  budesonide-formoterol (SYMBICORT) 160-4.5 MCG/ACT inhaler Inhale 2 puffs into the lungs 2 (two) times daily. 10/17/16   Theodoro Grist, MD  clopidogrel (PLAVIX) 75 MG tablet Take 1 tablet by mouth daily. 12/16/15   [provider]  furosemide (LASIX) 20 MG tablet Take 1 tablet by mouth daily. 12/25/15   [provider]  gabapentin (NEURONTIN) 600 MG tablet Take 1 tablet by mouth at bedtime.  11/06/15   [provider]  GLIPIZIDE XL 5 MG 24 hr tablet Take 1 tablet by mouth daily. 12/16/15   [provider]  hydrALAZINE (APRESOLINE) 50 MG tablet Take 1 tablet by mouth 2 (two) times daily. 06/17/17   [provider]  insulin aspart (NOVOLOG) 100 UNIT/ML injection Inject 15 Units into the skin 2 (two) times daily with a meal. Takes with Lunch and Higher education careers adviser, Historical, MD  Insulin Detemir (LEVEMIR FLEXPEN) 100 UNIT/ML Pen Inject 40 Units into the skin daily at 10 pm.    [provider]  ipratropium-albuterol (DUONEB) 0.5-2.5 (3) MG/3ML SOLN Take 3 mLs by nebulization every 4 (four) hours. 10/17/16   Theodoro Grist, MD  isosorbide mononitrate (IMDUR) 60 MG 24 hr tablet Take 1 tablet by mouth at bedtime.  12/21/15   [provider]  levothyroxine (SYNTHROID, LEVOTHROID) 25 MCG tablet Take 1 tablet by mouth daily. 12/25/15   [provider]  lidocaine (XYLOCAINE) 2 % solution Use as directed 10 mLs in the mouth or throat as needed. 08/29/18   Enid Derry, PA-C  lisinopril (PRINIVIL,ZESTRIL) 40 MG tablet Take 1 tablet by mouth daily. 12/25/15   [provider]  metoprolol tartrate (LOPRESSOR) 25 MG tablet Take 75 mg by mouth every morning.  01/08/16   [provider]  pantoprazole (PROTONIX) 20 MG tablet Take 1 tablet (20 mg total) by mouth daily. 07/14/17 07/14/18  [name], MD  traMADol (ULTRAM) 50 MG tablet Take 1 tablet by mouth every 8 (eight) hours as needed. 07/09/17   [provider]  trolamine salicylate (ASPERCREME) 10 % cream Apply topically as needed for muscle pain (Left shoulder, upper arm. Musculoskeletal pains). 10/17/16   [name], MD    Allergies Patient has no known allergies.  Family History  Problem Relation Age of Onset  . Breast cancer Mother   . COPD Father     Social History Social History   Tobacco Use  . Smoking status: Passive Smoke Exposure - Never Smoker  . Smokeless tobacco: Never Used  Vaping Use  . Vaping Use: Never used  Substance Use Topics  . Alcohol use: No  . Drug use: No    Review of Systems Constitutional: No fever/chills Eyes: No visual changes. ENT: No trauma. Cardiovascular: Denies chest pain. Respiratory: Denies shortness of breath. Gastrointestinal: No abdominal pain.  No nausea, no vomiting.   Genitourinary: Negative for dysuria. Musculoskeletal: Positive left foot pain.  Positive right-sided rib pain. Skin: Negative for rash. Neurological: Negative for headaches, focal weakness or numbness. ____________________________________________   PHYSICAL EXAM:  VITAL SIGNS: ED Triage Vitals  Enc Vitals Group     BP 11/14/19 1307 (!) 215/100     Pulse Rate 11/14/19 1307 70     Resp 11/14/19 1308 20     Temp 11/14/19 1307 98.2 F (36.8 C)     Temp Source 11/14/19 1307 Oral     SpO2 11/14/19 1307 92 %     Weight --      Height --      Head Circumference --      Peak Flow --      Pain Score --      Pain Loc --      Pain Edu? --      Excl. in GC? --    Constitutional: Alert and oriented. Well appearing and in no acute distress.  Patient is deaf and uses sign language to communicate. Eyes: Conjunctivae are normal. PERRL. EOMI. Head: Atraumatic. Nose: No congestion/rhinnorhea. Mouth/Throat: Mucous membranes are moist.  Oropharynx non-erythematous. Neck: No stridor.  No cervical spine tenderness palpation.  No skin discoloration or abrasions noted. Cardiovascular: Normal rate, regular  rhythm. Grossly normal heart sounds.  Good peripheral circulation. Respiratory: Normal respiratory effort.  No retractions. Lungs CTAB.  On palpation of the right lateral ribs there is some tenderness noted. Gastrointestinal: Soft and nontender. No distention.  Bowel sounds normoactive x4 quadrants. Musculoskeletal: On examination of the left foot there is moderate soft tissue edema noted on the dorsal aspect over the metatarsals.  The second and third digit are also swollen but no erythema, ecchymosis or skin abrasion.  Pulses present.  No tenderness is noted on palpation of the knees bilaterally.  No tenderness of the left thigh.  Patient does have some minimal tenderness with compression of the pelvis.  Patient was able to get to a sitting position and there is no  tenderness noted on palpation of the thoracic and lumbar spine.  Patient states that she is sore somewhat but this is her normal "soreness". Neurologic:  Normal speech and language. No gross focal neurologic deficits are appreciated.  Skin:  Skin is warm, dry and intact. No rash noted. Psychiatric: Mood and affect are normal. Speech and behavior are normal.  ____________________________________________   LABS (all labs ordered are listed, but only abnormal results are displayed)  Labs Reviewed - No data to display ____________________________________________   RADIOLOGY   Official radiology report(s): No results found.  ____________________________________________   PROCEDURES  Procedure(s) performed (including Critical Care):  Procedures Postop shoe was applied to the left foot.  ____________________________________________   INITIAL IMPRESSION / ASSESSMENT AND PLAN / ED COURSE  As part of my medical decision making, I reviewed the following data within the electronic MEDICAL RECORD NUMBER Notes from prior ED visits and [place] Controlled Substance Database  Patient is instructed to ice and elevate her foot and ankle as  needed for pain and swelling.  Patient was placed in a postop shoe and she normally walks with a walker.  Patient is to follow-up with podiatrist on call.  Contact information was given to her and her family.  Patient was made aware that she does have a closed fracture of her fifth metatarsal bone.  Tramadol was given to the patient while in the emergency department.  She states that she has tramadol at home which she will continue to take for pain as needed.  ____________________________________________   FINAL CLINICAL IMPRESSION(S) / ED DIAGNOSES  Final diagnoses:  Pain  Closed fracture of head of metatarsal bone of left foot, initial encounter  Sprain of left ankle, unspecified ligament, initial encounter  Contusion of left hip, initial encounter  Contusion of ribs, right, initial encounter  Fall at home, initial encounter     ED Discharge Orders    None       Note:  This document was prepared using Dragon voice recognition software and may include unintentional dictation errors.    Tommi Rumps, PA-C 11/17/19 8299    Jene Every, MD 11/20/19 [phone]

## 2019-11-14 NOTE — ED Triage Notes (Signed)
Reportedly hearing impaired patient arrives via [facility]  from home after a fall yesterday. Family reports ecchymosis and swelling to (L) ankle and that ankle "seems tender". Per EMS family also reporting (L) flank bruising but family reports area does not hurt patient.

## 2019-11-24 ENCOUNTER — Other Ambulatory Visit: Payer: Self-pay

## 2019-11-24 ENCOUNTER — Emergency Department: Payer: Medicare HMO

## 2019-11-24 ENCOUNTER — Emergency Department
Admission: EM | Admit: 2019-11-24 | Discharge: 2019-11-25 | Disposition: A | Discharge status: Home / Self Care | Payer: Medicare HMO | Attending: Emergency Medicine | Admitting: Emergency Medicine

## 2019-11-24 DIAGNOSIS — Y999 Unspecified external cause status: Secondary | ICD-10-CM | POA: Diagnosis not present

## 2019-11-24 DIAGNOSIS — Y92199 Unspecified place in other specified residential institution as the place of occurrence of the external cause: Secondary | ICD-10-CM | POA: Diagnosis not present

## 2019-11-24 DIAGNOSIS — Z794 Long term (current) use of insulin: Secondary | ICD-10-CM | POA: Insufficient documentation

## 2019-11-24 DIAGNOSIS — S22070A Wedge compression fracture of T9-T10 vertebra, initial encounter for closed fracture: Secondary | ICD-10-CM | POA: Insufficient documentation

## 2019-11-24 DIAGNOSIS — Y939 Activity, unspecified: Secondary | ICD-10-CM | POA: Insufficient documentation

## 2019-11-24 DIAGNOSIS — Z79899 Other long term (current) drug therapy: Secondary | ICD-10-CM | POA: Insufficient documentation

## 2019-11-24 DIAGNOSIS — I509 Heart failure, unspecified: Secondary | ICD-10-CM | POA: Diagnosis not present

## 2019-11-24 DIAGNOSIS — E1122 Type 2 diabetes mellitus with diabetic chronic kidney disease: Secondary | ICD-10-CM | POA: Diagnosis not present

## 2019-11-24 DIAGNOSIS — S24109A Unspecified injury at unspecified level of thoracic spinal cord, initial encounter: Secondary | ICD-10-CM | POA: Diagnosis present

## 2019-11-24 DIAGNOSIS — I130 Hypertensive heart and chronic kidney disease with heart failure and stage 1 through stage 4 chronic kidney disease, or unspecified chronic kidney disease: Secondary | ICD-10-CM | POA: Diagnosis not present

## 2019-11-24 DIAGNOSIS — N183 Chronic kidney disease, stage 3 unspecified: Secondary | ICD-10-CM | POA: Diagnosis not present

## 2019-11-24 DIAGNOSIS — S22080A Wedge compression fracture of T11-T12 vertebra, initial encounter for closed fracture: Secondary | ICD-10-CM

## 2019-11-24 DIAGNOSIS — J441 Chronic obstructive pulmonary disease with (acute) exacerbation: Secondary | ICD-10-CM | POA: Diagnosis not present

## 2019-11-24 DIAGNOSIS — S32010A Wedge compression fracture of first lumbar vertebra, initial encounter for closed fracture: Secondary | ICD-10-CM | POA: Diagnosis not present

## 2019-11-24 DIAGNOSIS — R109 Unspecified abdominal pain: Secondary | ICD-10-CM | POA: Insufficient documentation

## 2019-11-24 DIAGNOSIS — R809 Proteinuria, unspecified: Secondary | ICD-10-CM

## 2019-11-24 DIAGNOSIS — W19XXXA Unspecified fall, initial encounter: Secondary | ICD-10-CM | POA: Insufficient documentation

## 2019-11-24 DIAGNOSIS — Z7722 Contact with and (suspected) exposure to environmental tobacco smoke (acute) (chronic): Secondary | ICD-10-CM | POA: Insufficient documentation

## 2019-11-24 DIAGNOSIS — R410 Disorientation, unspecified: Secondary | ICD-10-CM | POA: Diagnosis not present

## 2019-11-24 DIAGNOSIS — Z7982 Long term (current) use of aspirin: Secondary | ICD-10-CM | POA: Diagnosis not present

## 2019-11-24 LAB — COMPREHENSIVE METABOLIC PANEL
ALT: 14 U/L (ref 0–44)
AST: 17 U/L (ref 15–41)
Albumin: 2.9 g/dL — ABNORMAL LOW (ref 3.5–5.0)
Alkaline Phosphatase: 95 U/L (ref 38–126)
Anion gap: 8 (ref 5–15)
BUN: 45 mg/dL — ABNORMAL HIGH (ref 8–23)
CO2: 25 mmol/L (ref 22–32)
Calcium: 8.4 mg/dL — ABNORMAL LOW (ref 8.9–10.3)
Chloride: 107 mmol/L (ref 98–111)
Creatinine, Ser: 2.09 mg/dL — ABNORMAL HIGH (ref 0.44–1.00)
GFR calc Af Amer: 25 mL/min — ABNORMAL LOW (ref >60)
GFR calc non Af Amer: 21 mL/min — ABNORMAL LOW (ref >60)
Glucose, Bld: 216 mg/dL — ABNORMAL HIGH (ref 70–99)
Potassium: 3.9 mmol/L (ref 3.5–5.1)
Sodium: 140 mmol/L (ref 135–145)
Total Bilirubin: 0.7 mg/dL (ref 0.3–1.2)
Total Protein: 6 g/dL — ABNORMAL LOW (ref 6.5–8.1)

## 2019-11-24 LAB — CBC
HCT: 37.7 % (ref 36.0–46.0)
Hemoglobin: 11.6 g/dL — ABNORMAL LOW (ref 12.0–15.0)
MCH: 27 pg (ref 26.0–34.0)
MCHC: 30.8 g/dL (ref 30.0–36.0)
MCV: 87.9 fL (ref 80.0–100.0)
Platelets: 208 10*3/uL (ref 150–400)
RBC: 4.29 MIL/uL (ref 3.87–5.11)
RDW: 14.8 % (ref 11.5–15.5)
WBC: 5.4 10*3/uL (ref 4.0–10.5)
nRBC: 0 % (ref 0.0–0.2)

## 2019-11-24 LAB — TROPONIN I (HIGH SENSITIVITY)
Troponin I (High Sensitivity): 20 ng/L — ABNORMAL HIGH (ref <18)
Troponin I (High Sensitivity): 19 ng/L — ABNORMAL HIGH (ref <18)

## 2019-11-24 MED ORDER — ONDANSETRON HCL 4 MG/2ML IJ SOLN
4.0000 mg | Freq: Once | INTRAMUSCULAR | Status: AC
  Administered 2019-11-24: 4 mg via INTRAVENOUS
  Filled 2019-11-24: qty 2

## 2019-11-24 MED ORDER — IOHEXOL 9 MG/ML PO SOLN
500.0000 mL | ORAL | Status: AC
  Administered 2019-11-24: 500 mL via ORAL

## 2019-11-24 MED ORDER — SODIUM CHLORIDE 0.9% FLUSH: 3.0000 mL | Freq: Once | INTRAVENOUS | Status: DC

## 2019-11-24 MED ORDER — MORPHINE SULFATE (PF) 2 MG/ML IV SOLN
2.0000 mg | Freq: Once | INTRAVENOUS | Status: AC
  Administered 2019-11-24: 2 mg via INTRAVENOUS
  Filled 2019-11-24: qty 1

## 2019-11-24 MED ORDER — SODIUM CHLORIDE 0.9 % IV BOLUS
1000.0000 mL | Freq: Once | INTRAVENOUS | Status: AC
  Administered 2019-11-24: 1000 mL via INTRAVENOUS

## 2019-11-24 NOTE — ED Notes (Addendum)
Pt's family reports pt with c/o intermittent abdominal pain and periods of confusion over the last few days.

## 2019-11-24 NOTE — ED Triage Notes (Addendum)
Pt comes via [facility] with c/o mid upper abdominal pain. Pt is deaf and husband by side to help.  Interpreter on stick called. Pt communicating but interpreter having trouble relaying what pt is signing. Pt does states she has been peeing more often. Husband reports pt is diabetic.   Pt had fall couple weeks ago per husband. He reports the confusion started more after she fell. Pt states some blurry vision. Pt states some weakness. Pt wear O2 at bedtime, but on 3L [place] at this time per EMS. Pt only takes baby aspirin.  Pt also states some SOB.

## 2019-11-25 ENCOUNTER — Emergency Department: Payer: Medicare HMO

## 2019-11-25 LAB — URINALYSIS, COMPLETE (UACMP) WITH MICROSCOPIC
Bilirubin Urine: NEGATIVE
Glucose, UA: NEGATIVE mg/dL
Hgb urine dipstick: NEGATIVE
Ketones, ur: NEGATIVE mg/dL
Leukocytes,Ua: NEGATIVE
Nitrite: NEGATIVE
Protein, ur: >=300 mg/dL — AB
Specific Gravity, Urine: 1.016 (ref 1.005–1.030)
pH: 5 (ref 5.0–8.0)

## 2019-11-25 MED ORDER — OXYCODONE-ACETAMINOPHEN 5-325 MG PO TABS: 1.0000 | ORAL_TABLET | ORAL | 0 refills | Status: AC | PRN

## 2019-11-25 NOTE — ED Notes (Signed)
PT cleansed of incontinent urine and bed linen changed.

## 2019-11-25 NOTE — ED Provider Notes (Signed)
[facility] Emergency Department Provider Note  ____________________________________________   First MD Initiated Contact with Patient 11/24/19 2322     (approximate)  I have reviewed the triage vital signs and the nursing notes.  History obtained via husband performing sign language HISTORY  Chief Complaint Altered Mental Status    HPI Connie Wilkins is a 86 y.o. female with below list of previous medical conditions including COPD MI CHF presents to the emergency department secondary to mid abdominal and back pain which began following a fall 2 weeks ago.  Patient's husband states that she accidentally lost her balance and had a fall 2 weeks ago.  No  loss of consciousness at the time of the fall.  Patient's husband however states that he has noted that his wife has had some weakness and periods of confusion since the fall.  Patient states that current pain score 7 out of 10 worse with movement.  Patient denies any nausea or vomiting.  Patient does admit to some dyspnea.  Patient denies any chest pain.       Past Medical History:  Diagnosis Date  . CHF (congestive heart failure) (HCC)   . COPD (chronic obstructive pulmonary disease) (HCC)   . Deaf   . Diabetes mellitus without complication (HCC)   . MI, old     Patient Active Problem List   Diagnosis Date Noted  . Musculoskeletal chest pain 10/17/2016  . Essential hypertension 10/17/2016  . CKD (chronic kidney disease), stage III 10/17/2016  . Dyspnea 10/17/2016  . Diabetes mellitus type 2 in obese (HCC) 10/17/2016  . COPD with acute exacerbation (HCC) 10/15/2016  . COPD exacerbation (HCC) 01/10/2016    Past Surgical History:  Procedure Laterality Date  . BACK SURGERY    . CARDIAC SURGERY    . CORONARY STENT PLACEMENT    . REPLACEMENT TOTAL KNEE      Prior to Admission medications   Medication Sig Start Date End Date Taking? Authorizing Provider  amLODipine (NORVASC) 10 MG tablet Take  1 tablet (10 mg total) by mouth daily. 10/18/16  Yes [name], [name], MD  aspirin EC 81 MG tablet Take 81 mg by mouth daily.   Yes [provider]  atorvastatin (LIPITOR) 80 MG tablet Take 1 tablet by mouth daily at 6 PM. 12/16/15  Yes [provider]  budesonide-formoterol (SYMBICORT) 160-4.5 MCG/ACT inhaler Inhale 2 puffs into the lungs 2 (two) times daily. 10/17/16  Yes [name], [name], MD  clopidogrel (PLAVIX) 75 MG tablet Take 1 tablet by mouth daily. 12/16/15  Yes [provider]  furosemide (LASIX) 20 MG tablet Take 1 tablet by mouth daily. 12/25/15  Yes [provider]  gabapentin (NEURONTIN) 600 MG tablet Take 1 tablet by mouth at bedtime.  11/06/15  Yes [provider]  GLIPIZIDE XL 5 MG 24 hr tablet Take 1 tablet by mouth daily. 12/16/15  Yes [provider]  hydrALAZINE (APRESOLINE) 50 MG tablet Take 1 tablet by mouth 2 (two) times daily. 06/17/17  Yes [provider]  insulin aspart (NOVOLOG) 100 UNIT/ML injection Inject 20 Units into the skin daily with supper. Takes with Lunch and IT consultantDinner   Yes [provider]  Insulin Detemir (LEVEMIR FLEXPEN) 100 UNIT/ML Pen Inject 34 Units into the skin daily at 10 pm.    Yes [provider]  ipratropium-albuterol (DUONEB) 0.5-2.5 (3) MG/3ML SOLN Take 3 mLs by nebulization every 4 (four) hours. Patient taking differently: Take 3 mLs by nebulization every 4 (  four) hours as needed.  10/17/16  Yes [name], MD  isosorbide mononitrate (IMDUR) 60 MG 24 hr tablet Take 1 tablet by mouth at bedtime.  12/21/15  Yes [provider]  levothyroxine (SYNTHROID, LEVOTHROID) 25 MCG tablet Take 1 tablet by mouth daily. 12/25/15  Yes [provider]  lidocaine (XYLOCAINE) 2 % solution Use as directed 10 mLs in the mouth or throat as needed. 08/29/18  Yes Enid Derry, PA-C  lisinopril (PRINIVIL,ZESTRIL) 40 MG tablet Take 1 tablet by mouth daily. 12/25/15  Yes [provider]  metoprolol tartrate (LOPRESSOR) 25 MG tablet Take 75 mg by mouth every morning.  01/08/16  Yes [provider]  pantoprazole (PROTONIX) 40 MG tablet Take 40 mg by mouth daily. 11/17/19  Yes [provider]  penicillin v potassium (VEETID) 500 MG tablet Take 500 mg by mouth 4 (four) times daily. 11/16/19  Yes [provider]  traMADol (ULTRAM) 50 MG tablet Take 1 tablet by mouth every 8 (eight) hours as needed. 07/09/17  Yes [provider]  trolamine salicylate (ASPERCREME) 10 % cream Apply topically as needed for muscle pain (Left shoulder, upper arm. Musculoskeletal pains). 10/17/16  Yes [name], MD    Allergies Patient has no known allergies.  Family History  Problem Relation Age of Onset  . Breast cancer Mother   . COPD Father     Social History Social History   Tobacco Use  . Smoking status: Passive Smoke Exposure - Never Smoker  . Smokeless tobacco: Never Used  Vaping Use  . Vaping Use: Never used  Substance Use Topics  . Alcohol use: No  . Drug use: No    Review of Systems Constitutional: No fever/chills Eyes: No visual changes. ENT: No sore throat. Cardiovascular: Denies chest pain. Respiratory: Denies shortness of breath. Gastrointestinal: Positive for abdominal pain.  No nausea, no vomiting.  No diarrhea.  No constipation. Genitourinary: Negative for dysuria. Musculoskeletal: Negative for neck pain.  Positive for back pain. Integumentary: Negative for rash. Neurological: Negative for headaches, focal weakness or numbness.   ____________________________________________   PHYSICAL EXAM:  VITAL SIGNS: ED Triage Vitals  Enc Vitals Group     BP 11/24/19 1557 (!) 153/81     Pulse Rate 11/24/19 1557 (!) 57     Resp 11/24/19 2225 18     Temp 11/24/19 1557 98.5 F (36.9 C)     Temp Source 11/24/19 1557 Oral     SpO2 11/24/19 1557 99 %     Weight 11/24/19 1558 108.9 kg (240 lb)     Height 11/24/19 1558 1.6  m (5\' 3" )     Head Circumference --      Peak Flow --      Pain Score 11/24/19 1605 6     Pain Loc --      Pain Edu? --      Excl. in GC? --     Constitutional: Alert and oriented.  Eyes: Conjunctivae are normal.  Head: Atraumatic. Mouth/Throat: Patient is wearing a mask. Neck: No stridor.  No meningeal signs.   Cardiovascular: Normal rate, regular rhythm. Good peripheral circulation. Grossly normal heart sounds. Respiratory: Normal respiratory effort.  No retractions. Gastrointestinal: Soft and nontender. No distention.  Musculoskeletal: Pain to palpation T9-L2 Neurologic:  . No gross focal neurologic deficits are appreciated.  Skin:  Skin is warm, dry and intact. Psychiatric: Mood and affect are normal. Speech and behavior are normal.  ____________________________________________   LABS (all labs ordered are  listed, but only abnormal results are displayed)  Labs Reviewed  COMPREHENSIVE METABOLIC PANEL - Abnormal; Notable for the following components:      Result Value   Glucose, Bld 216 (*)    BUN 45 (*)    Creatinine, Ser 2.09 (*)    Calcium 8.4 (*)    Total Protein 6.0 (*)    Albumin 2.9 (*)    GFR calc non Af Amer 21 (*)    GFR calc Af Amer 25 (*)    All other components within normal limits  CBC - Abnormal; Notable for the following components:   Hemoglobin 11.6 (*)    All other components within normal limits  TROPONIN I (HIGH SENSITIVITY) - Abnormal; Notable for the following components:   Troponin I (High Sensitivity) 20 (*)    All other components within normal limits  TROPONIN I (HIGH SENSITIVITY) - Abnormal; Notable for the following components:   Troponin I (High Sensitivity) 19 (*)    All other components within normal limits  URINALYSIS, COMPLETE (UACMP) WITH MICROSCOPIC  CBG MONITORING, ED   ____________________________________________  EKG  ED ECG REPORT I, Cimarron N Merril Isakson, the attending physician, personally viewed and interpreted this  ECG.   Date: 11/24/2019  EKG Time: 3:53 PM  Rate: 57  Rhythm: Sinus bradycardia  Axis: Normal  Intervals: Normal  ST&T Change: None  ____________________________________________  RADIOLOGY I, [place] N Kelan Pritt, personally viewed and evaluated these images (plain radiographs) as part of my medical decision making, as well as reviewing the written report by the radiologist.  ED MD interpretation: Chest x-ray revealed bibasilar atelectasis.  CT head revealed generalized ureteral atrophy no acute intracranial abnormality CT abdomen pelvis consistent with multilevel compression fractures T10-T11 and L1 T10 is 10% loss of height T11 30% loss of height L1 50% loss of height  Official radiology report(s): CT ABDOMEN PELVIS WO CONTRAST  Result Date: 11/25/2019 CLINICAL DATA:  Mid to upper abdominal pain, increased urinary frequency EXAM: CT ABDOMEN AND PELVIS WITHOUT CONTRAST TECHNIQUE: Multidetector CT imaging of the abdomen and pelvis was performed following the standard protocol without IV contrast. COMPARISON:  CT 03/22/2009 FINDINGS: Lower chest: Atelectatic changes in the otherwise clear lung bases. Cardiac size at the upper limits of normal. Three-vessel coronary artery disease. Abundant mediastinal fat. Hepatobiliary: Stable 1.4 cm hypodensity along the anterior right lobe liver (2/22) most compatible with cyst. Additional more ill-defined hypoattenuating lesion in the posterior right lobe liver is similar to prior measuring up to 3 cm in maximal diameter. Previously characterized as a likely hemangioma. Few additional subcentimeter hypodense foci in the liver too small to fully characterize on CT imaging but statistically likely benign. No new visible or concerning liver lesions. Patient is post cholecystectomy. Stable prominence of the biliary tree likely related to reservoir effect. No calcified intraductal gallstones. Pancreas: Mild pancreatic atrophy. No pancreatic ductal dilatation or  surrounding inflammatory changes. Spleen: Normal in size without focal abnormality. Adrenals/Urinary Tract: There is bilateral mild to moderate perinephric stranding, similar to minimally increased from the comparison exam. No visible or contour deforming renal lesions. No urolithiasis or hydronephrosis. Urinary bladder is largely decompressed at the time of exam and therefore poorly evaluated by CT imaging. No gross bladder abnormality. Stomach/Bowel: Small hiatal hernia. Stomach and duodenum are unremarkable. No small bowel thickening or dilatation. Hyperdense luminal contrast media traverses to the level of the cecum. No evidence of small-bowel obstruction. A normal appendix is visualized. Moderate colonic stool burden. No colonic dilatation or wall thickening.  Scattered colonic diverticula without focal inflammation to suggest diverticulitis. Question some mild rectal wall thickening with adjacent mesorectal and presacral fat stranding. Vascular/Lymphatic: Atherosclerotic calcifications throughout the abdominal aorta and branch vessels. No aneurysm or ectasia. No enlarged abdominopelvic lymph nodes. Reproductive: Anteverted uterus. 5.4 cm simple appearing cystic structure in the left adnexa. No other concerning adnexal lesions. Other: Abdominopelvic free air or fluid. Perinephric and presacral stranding, as above. Posterior body wall edema. Small diastasis rectus. Posterior injection granulomata. Additional posterior midline soft tissue changes compatible with prior posterior spur fine interventions. Musculoskeletal:  Multilevel compression deformities include: *Anterior wedging compression deformity at L1 with approximately 50% height loss anteriorly. *Anterior compression deformity T11 with 30% height loss. *Inferior endplate compression deformity T10 with at a 10% height loss of the inferior endplate. *Abrupt anterior angulation of the coccyx. Postsurgical changes at the L3-4 level. Grade 1 anterolisthesis L4  on L5. Bridging osteophytes in the lower thoracic spine may reflect features of diffuse idiopathic skeletal hyperostosis. Additional degenerative changes in the hips and pelvis. IMPRESSION: 1. Question some mild rectal wall thickening with adjacent mesorectal and presacral fat stranding, which could reflect proctitis. Correlate with patient's symptoms. 2. Bilateral mild to moderate perinephric stranding, similar to minimally increased from the comparison exam. Findings are nonspecific, but can be seen with ascending urinary tract infection as well as age or diminished renal function. Correlate with urinalysis. 3. 5.4 cm simple appearing cystic structure in the left adnexa. Recommend further evaluation with pelvic ultrasound on a nonemergent basis. 4. Multilevel compression deformities of the thoracolumbar spine at the T10, T11 and L1 levels, age indeterminate. Correlate for point tenderness. 5. Abrupt angulation of the coccyx, age indeterminate. Could assess for point tenderness at this location is well. 6. Aortic Atherosclerosis (ICD10-I70.0). Electronically Signed   By: Lovena Le M.D.   On: 11/25/2019 02:27   DG Chest 2 View  Result Date: 11/24/2019 CLINICAL DATA:  Mid upper abdominal pain. EXAM: CHEST - 2 VIEW COMPARISON:  November 14, 2019 FINDINGS: Mildly decreased lung volumes are seen which is likely secondary to the degree of patient inspiration. Very mild left basilar atelectasis and/or infiltrate is noted. There is a very small left pleural effusion. No pneumothorax is identified. The heart size and mediastinal contours are within normal limits. There is mild calcification of the aortic arch. Multilevel degenerative changes seen throughout the thoracic spine. IMPRESSION: 1. Very mild left basilar atelectasis and/or infiltrate. 2. Very small left pleural effusion. Electronically Signed   By: Virgina Norfolk M.D.   On: 11/24/2019 16:54   CT Head Wo Contrast  Result Date: 11/24/2019 CLINICAL DATA:   Altered mental status after a recent fall. EXAM: CT HEAD WITHOUT CONTRAST TECHNIQUE: Contiguous axial images were obtained from the base of the skull through the vertex without intravenous contrast. COMPARISON:  None. FINDINGS: Brain: There is moderate severity cerebral atrophy with widening of the extra-axial spaces and ventricular dilatation. There are areas of decreased attenuation within the white matter tracts of the supratentorial brain, consistent with microvascular disease changes. Vascular: No hyperdense vessel or unexpected calcification. Skull: Normal. Negative for fracture or focal lesion. Sinuses/Orbits: No acute finding. Other: None. IMPRESSION: 1. Generalized cerebral atrophy. 2. No acute intracranial abnormality. Electronically Signed   By: Virgina Norfolk M.D.   On: 11/24/2019 16:52    _________  Procedures   ____________________________________________   INITIAL IMPRESSION / MDM / ASSESSMENT AND PLAN / ED COURSE  As part of my medical decision making, I reviewed the following data  within the electronic MEDICAL RECORD NUMBER   86 year old female presented with above-stated history and physical exam secondary to abdominal and back pain with history of fall 2 weeks ago.  CT head revealed no acute intracranial abnormalities CT abdomen pelvis revealed T10-T11 and L1 compression fractures.  Patient was given IV morphine in the emergency department improvement of pain.  Laboratory data unremarkable with exception of creatinine of 2.09 which is up from the patient's creatinine of 1.50 on 08/29/2018.  Patient given 500 mL of normal saline.  Patient referred to Dr. Marcell Barlow neurosurgery for compression fractures of the spine.  Patient be prescribed Percocet for home spoke with the patient husband at length regarding side effects of Percocet.  ____________________________________________  FINAL CLINICAL IMPRESSION(S) / ED DIAGNOSES  Final diagnoses:  Compression fracture of T10 vertebra,  initial encounter (HCC)  Compression fracture of T11 vertebra, initial encounter (HCC)  Compression fracture of L1 vertebra, initial encounter (HCC)     MEDICATIONS GIVEN DURING THIS VISIT:  Medications  iohexol (OMNIPAQUE) 9 MG/ML oral solution 500 mL (500 mLs Oral Contrast Given 11/24/19 2346)  sodium chloride 0.9 % bolus 1,000 mL (0 mLs Intravenous Stopped 11/25/19 0207)  morphine 2 MG/ML injection 2 mg (2 mg Intravenous Given 11/24/19 2343)  ondansetron (ZOFRAN) injection 4 mg (4 mg Intravenous Given 11/24/19 2343)     ED Discharge Orders    None      *Please note:  Connie Wilkins was evaluated in Emergency Department on 11/25/2019 for the symptoms described in the history of present illness. She was evaluated in the context of the global COVID-19 pandemic, which necessitated consideration that the patient might be at risk for infection with the SARS-CoV-2 virus that causes COVID-19. Institutional protocols and algorithms that pertain to the evaluation of patients at risk for COVID-19 are in a state of rapid change based on information released by regulatory bodies including the CDC and federal and state organizations. These policies and algorithms were followed during the patient's care in the ED.  Some ED evaluations and interventions may be delayed as a result of limited staffing during and after the pandemic.*  Note:  This document was prepared using Dragon voice recognition software and may include unintentional dictation errors.   Darci Current, MD 11/25/19 [phone]

## 2019-12-01 ENCOUNTER — Other Ambulatory Visit: Payer: Self-pay | Admitting: Nurse Practitioner

## 2019-12-01 DIAGNOSIS — S22000A Wedge compression fracture of unspecified thoracic vertebra, initial encounter for closed fracture: Secondary | ICD-10-CM

## 2019-12-01 DIAGNOSIS — M4856XA Collapsed vertebra, not elsewhere classified, lumbar region, initial encounter for fracture: Secondary | ICD-10-CM

## 2019-12-16 ENCOUNTER — Other Ambulatory Visit: Payer: Self-pay

## 2019-12-16 ENCOUNTER — Ambulatory Visit
Admission: RE | Admit: 2019-12-16 | Discharge: 2019-12-16 | Disposition: A | Discharge status: Home / Self Care | Payer: Medicare HMO | Source: Ambulatory Visit | Attending: Nurse Practitioner | Admitting: Nurse Practitioner

## 2019-12-16 DIAGNOSIS — S22000A Wedge compression fracture of unspecified thoracic vertebra, initial encounter for closed fracture: Secondary | ICD-10-CM | POA: Insufficient documentation

## 2019-12-16 DIAGNOSIS — M4856XA Collapsed vertebra, not elsewhere classified, lumbar region, initial encounter for fracture: Secondary | ICD-10-CM

## 2020-01-07 ENCOUNTER — Other Ambulatory Visit: Payer: Self-pay | Admitting: Physician Assistant

## 2020-01-13 LAB — SURGICAL PATHOLOGY

## 2020-02-02 DEATH — deceased

## 2021-08-06 IMAGING — CR DG FOOT COMPLETE 3+V*L*
3 series · 3 of 3 positions shown · non-contrast
Comparison: Left ankle series 05/03/2012.

CLINICAL DATA: 85-year-old female status post fall two days ago.
Increasing left foot pain.

EXAM:
LEFT FOOT - COMPLETE 3+ VIEW

[foot obl]
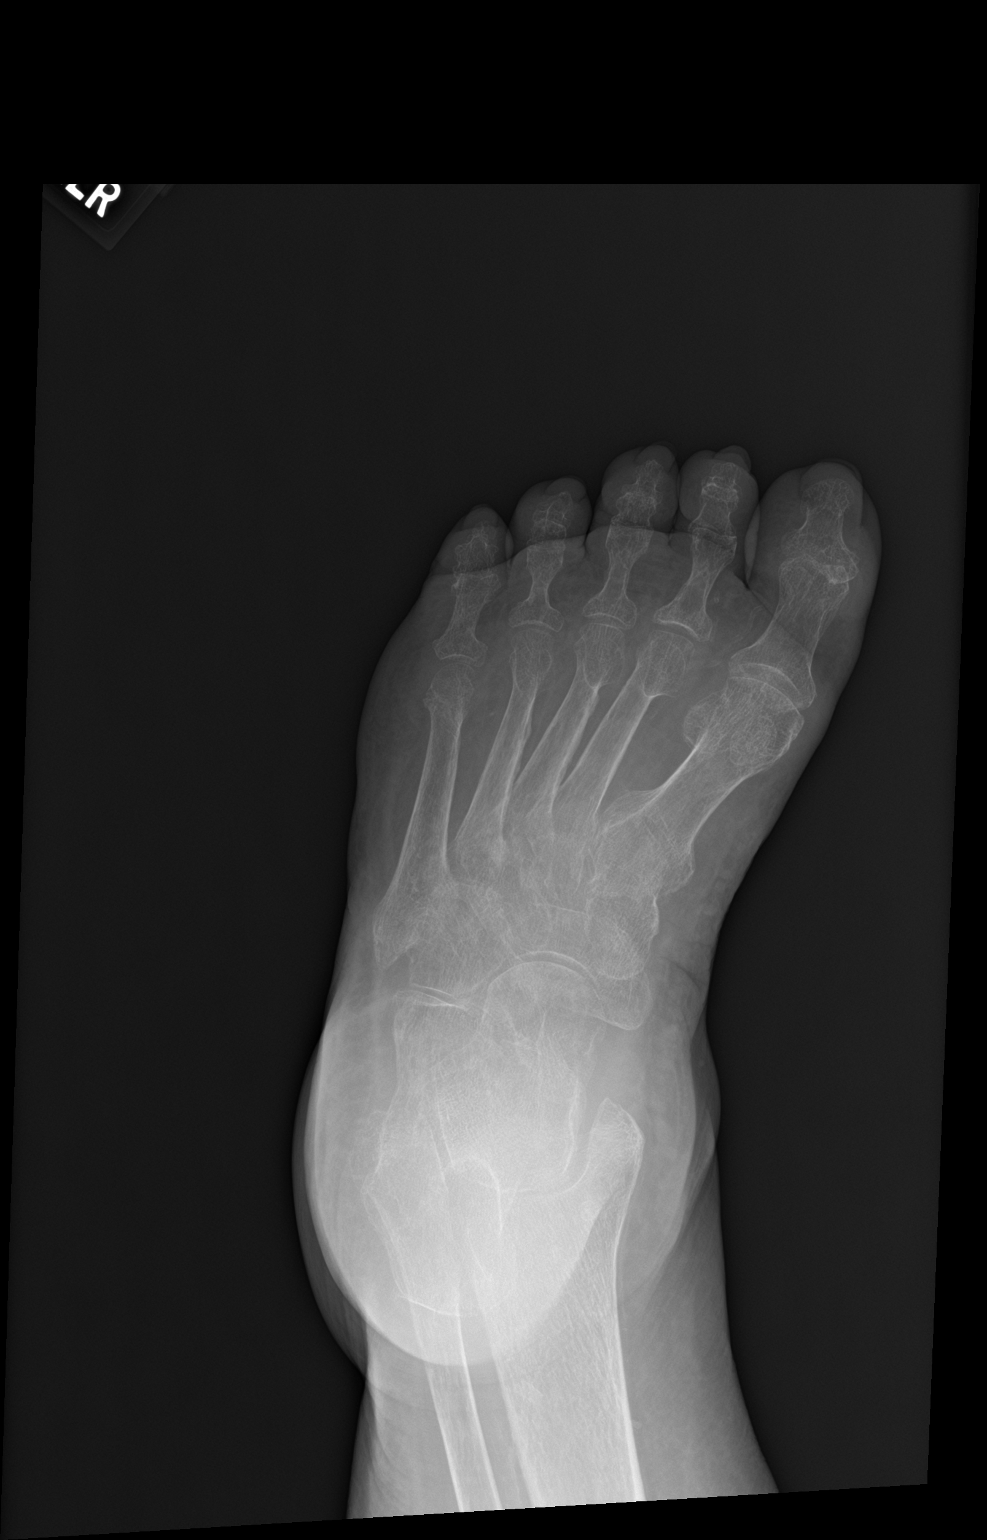

[foot lat]
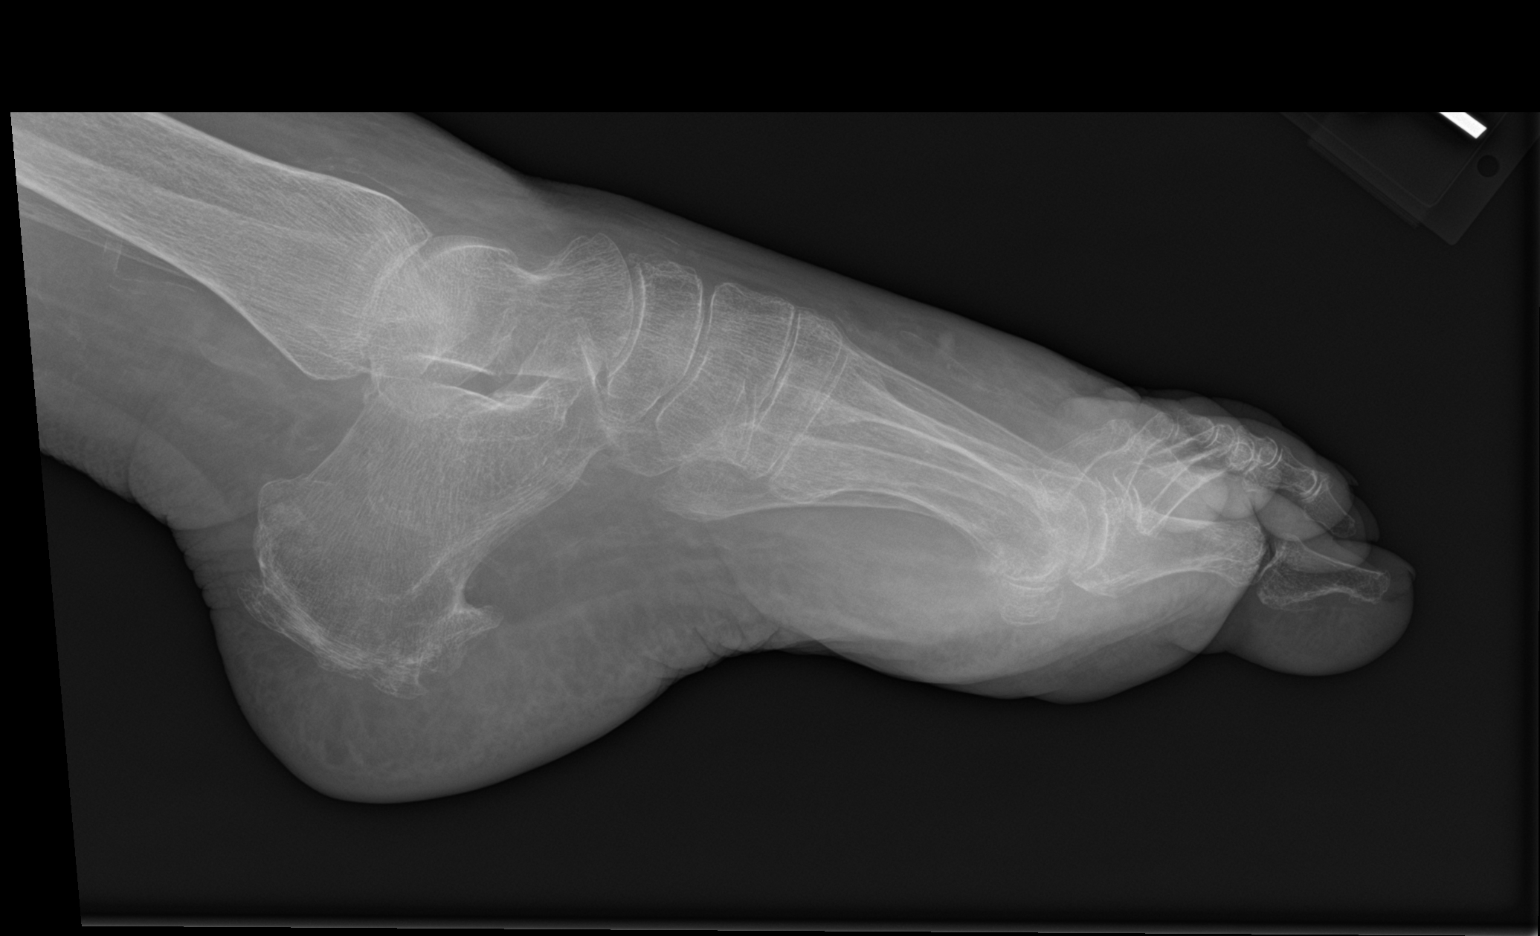

[foot ap]
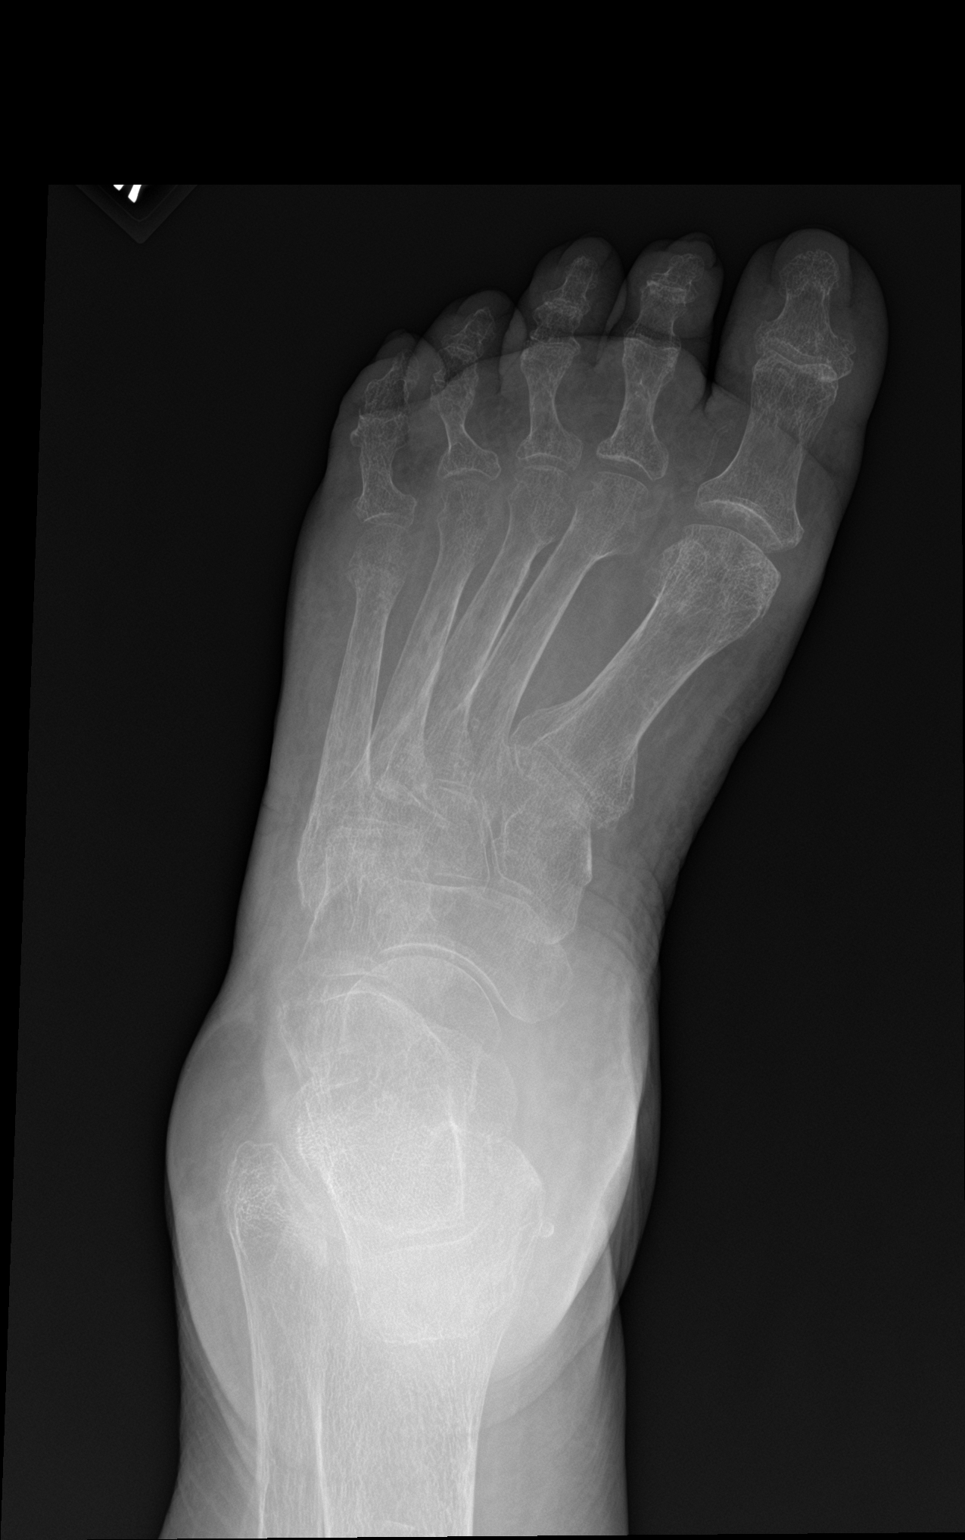

[3 of 3 positions shown; findings below may reference images not displayed]

FINDINGS: Osteopenia. Bulky calcified degenerative spurring at the calcaneus.
Calcified peripheral vascular disease. Diffusely heterogeneous
appearance of the soft tissues of unclear etiology and significance.
There does not seem to be subcutaneous emphysema. There is
generalized soft tissue swelling.

No tarsal bone fracture is identified. But there is subtle cortical
irregularity at the lateral head of the 5th metatarsal. No
displacement. No other metatarsal fracture. No phalanx fracture
identified.
IMPRESSION: 1. Questionable nondisplaced fracture of the left 5th metatarsal
head. Query point tenderness here which would corroborate fracture.
2. Osteopenia.  No other acute osseous abnormality identified.
3. Calcified peripheral vascular disease, soft tissue swelling, and
diffusely heterogeneous appearance of the soft tissues of unclear
significance.

## 2021-08-06 IMAGING — DX DG ANKLE COMPLETE 3+V*L*
4 series · 4 of 4 positions shown · non-contrast
Comparison: None.

CLINICAL DATA: Pain following fall

EXAM:
LEFT ANKLE COMPLETE - 3+ VIEW

[ankle ap (1 of 2)]
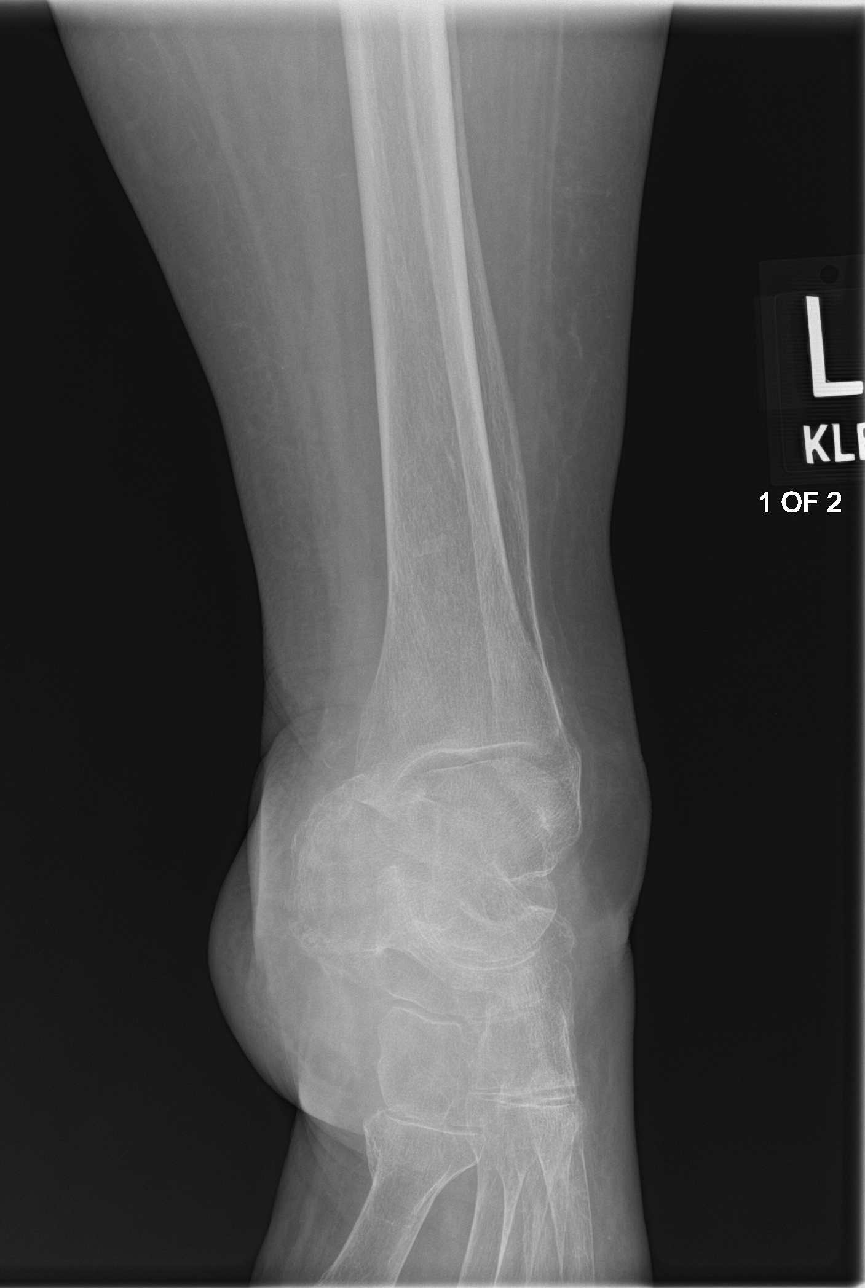

[ankle obl]
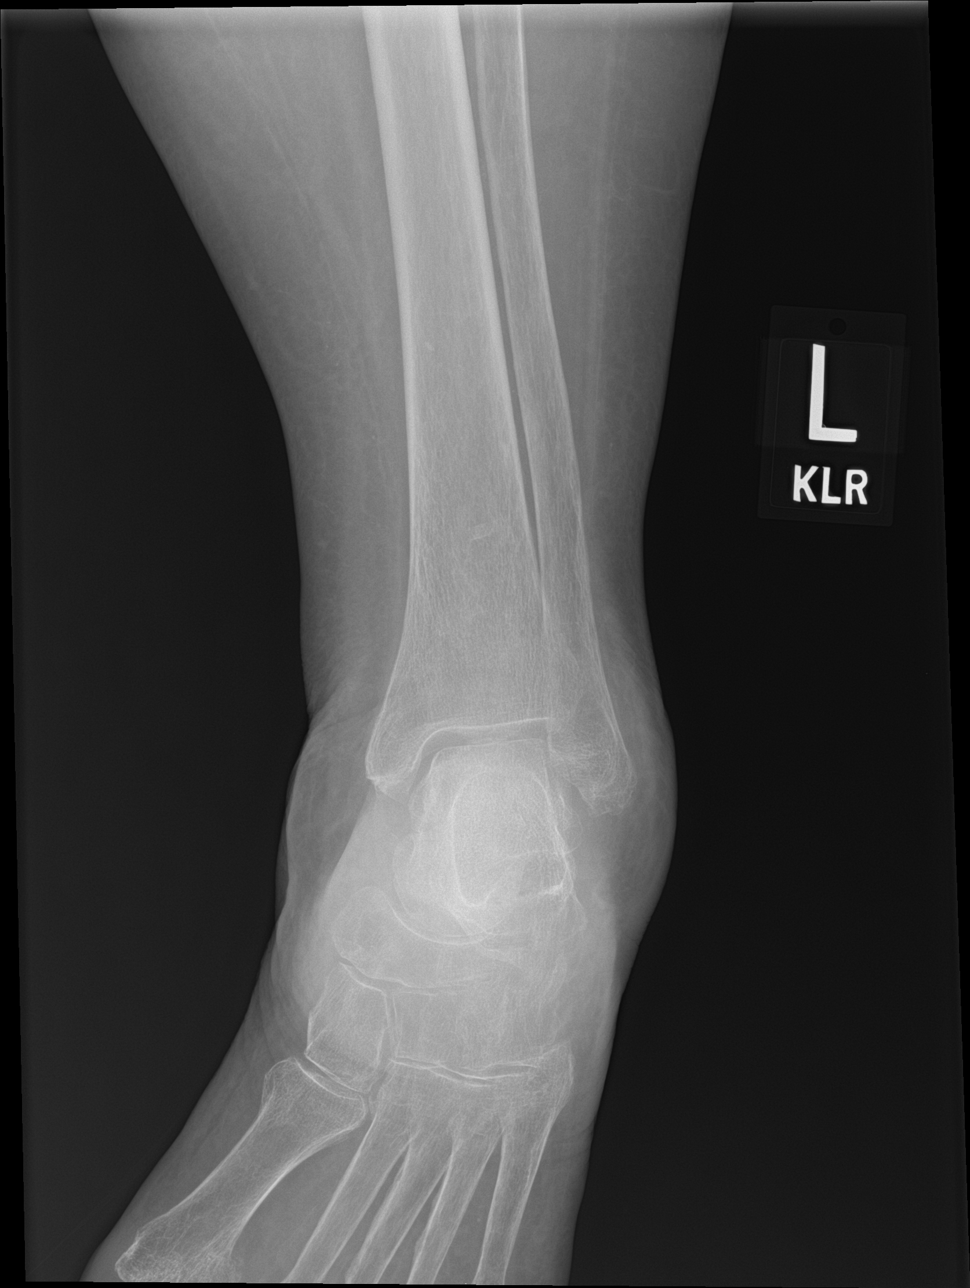

[ankle lat]
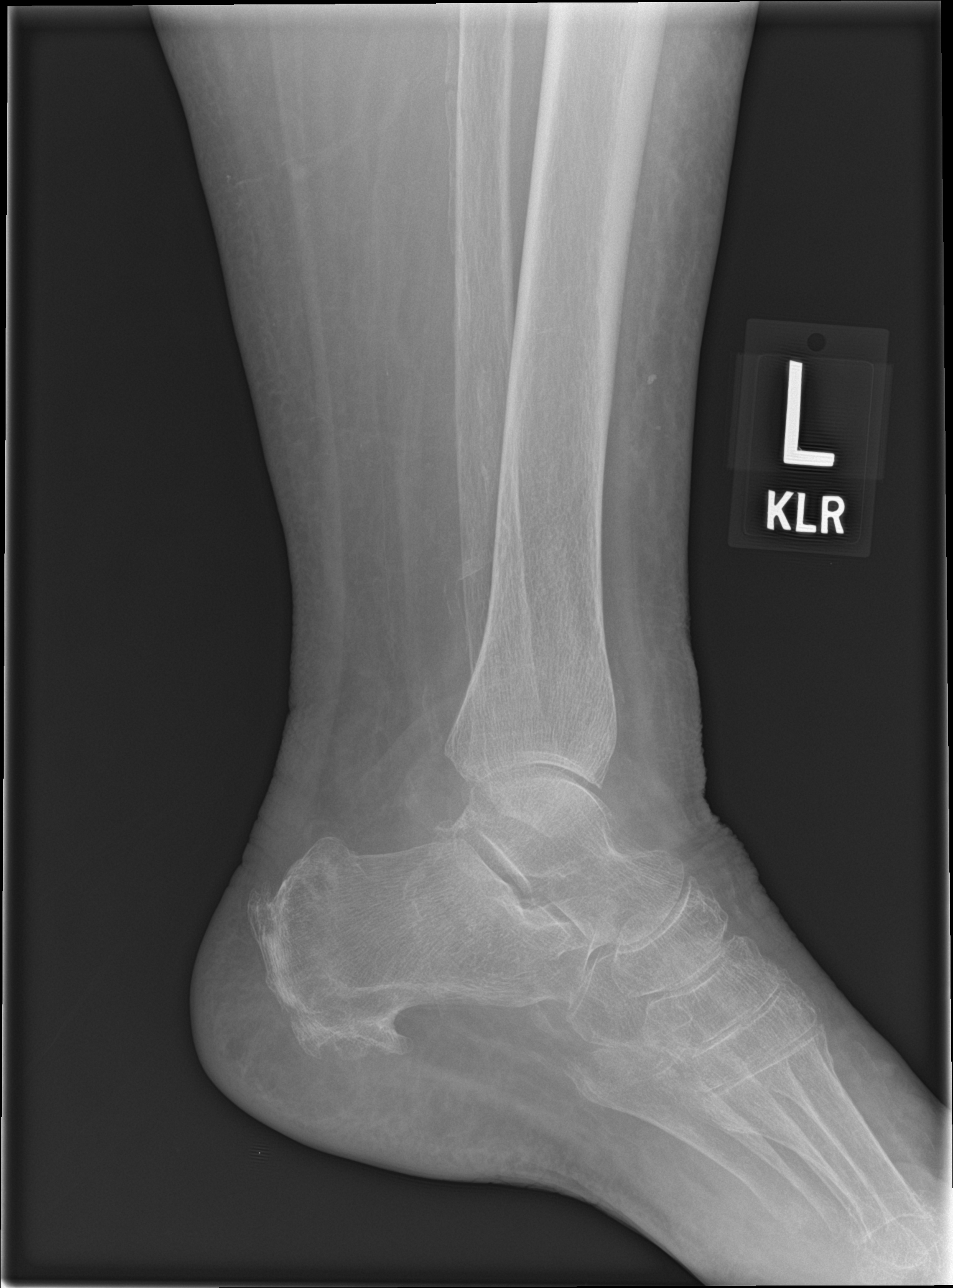

[ankle ap (2 of 2)]
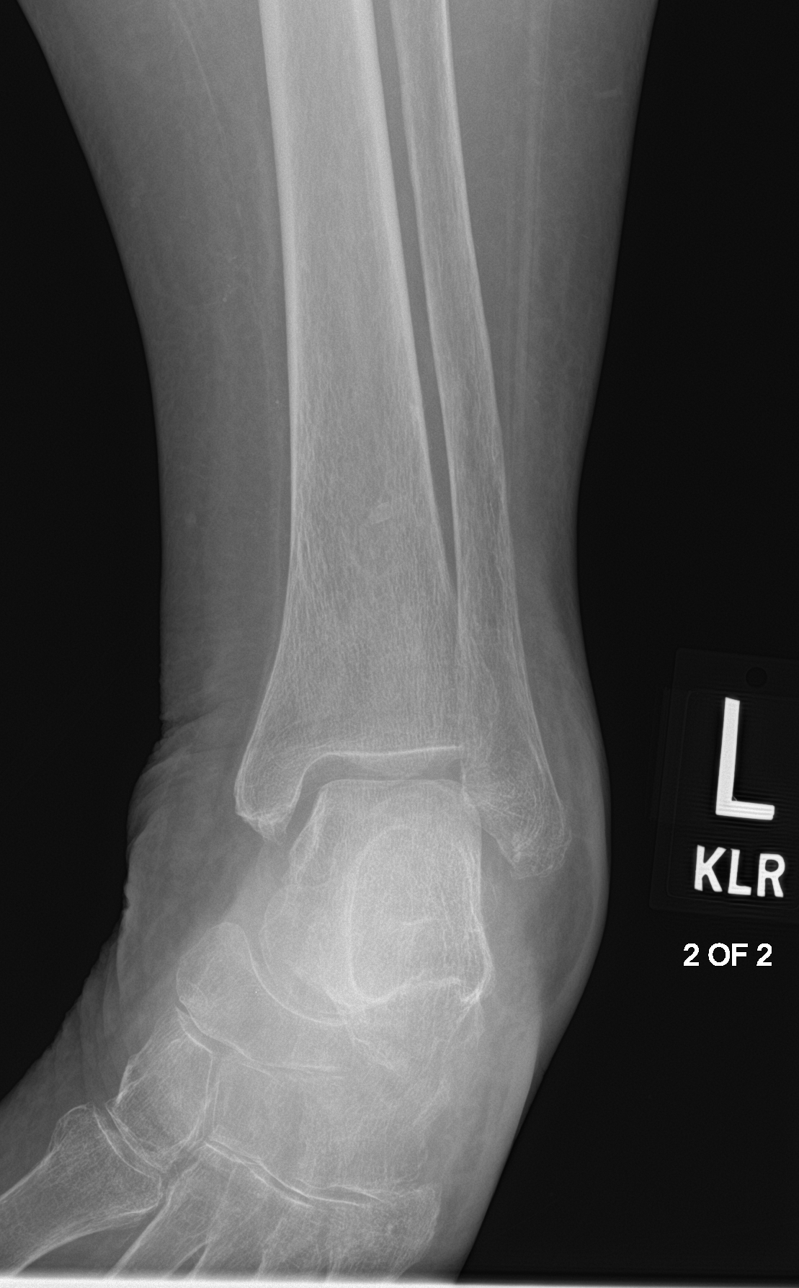

[4 of 4 positions shown; findings below may reference images not displayed]

FINDINGS: Frontal, oblique, and lateral views were obtained. There is
generalized soft tissue swelling. No evident fracture or joint
effusion. There is no appreciable joint space narrowing or erosion.
There are prominent posterior and inferior calcaneal spurs. Bones
are osteoporotic.
IMPRESSION: Diffuse soft tissue swelling. Bones osteoporotic. No evident acute
fracture. Prominent calcaneal spurs noted.

## 2021-08-06 IMAGING — CR DG RIBS W/ CHEST 3+V*R*
4 series · 4 of 4 positions shown · non-contrast
Comparison: None.

CLINICAL DATA: Recent fall with right-sided rib pain, initial
encounter

EXAM:
RIGHT RIBS AND CHEST - 3+ VIEW

[chest pa]
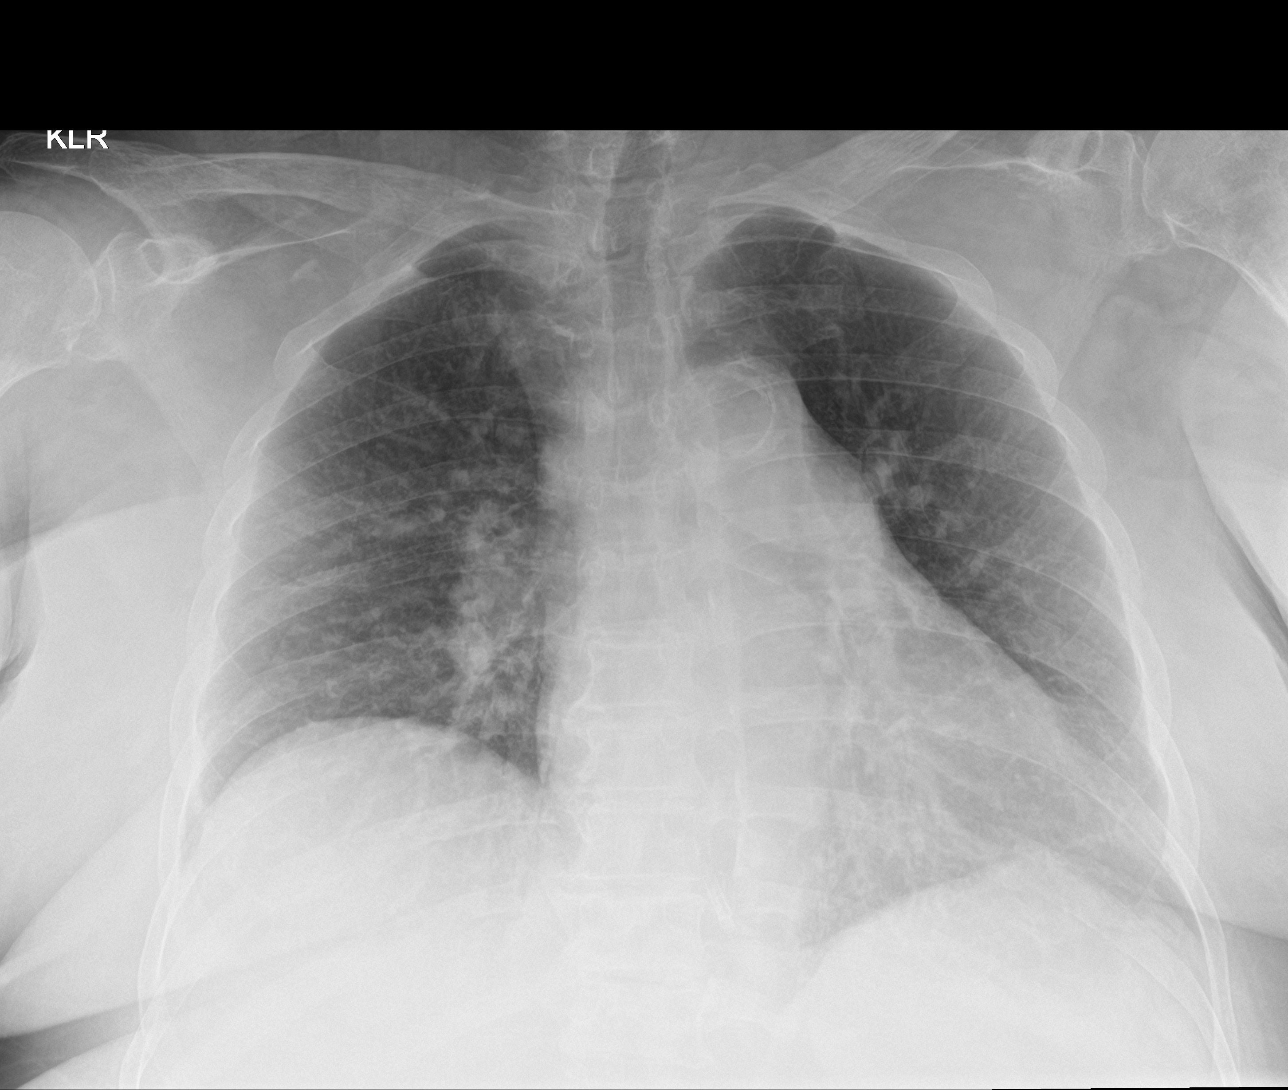

[rib ap (1 of 2)]
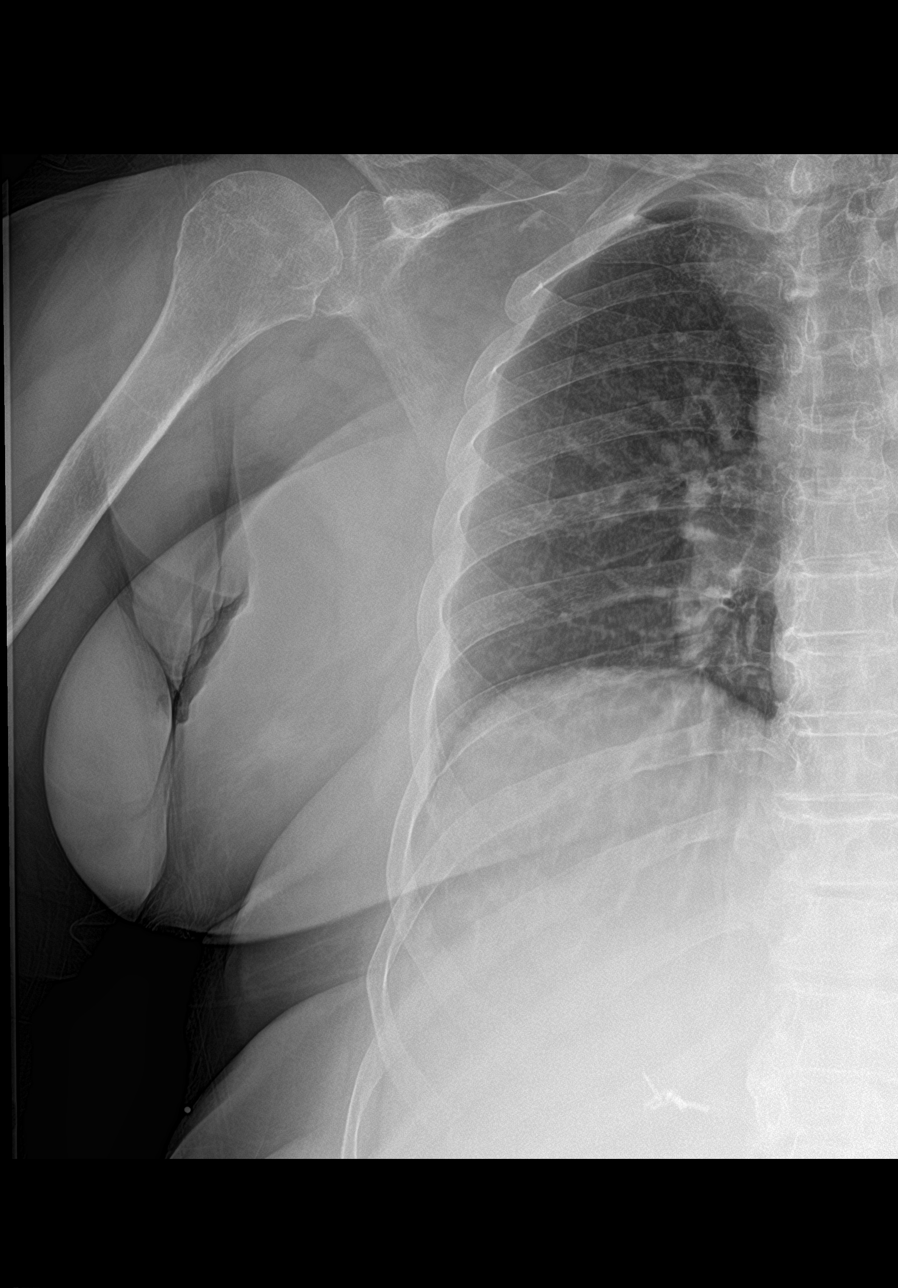

[rib ap (2 of 2)]
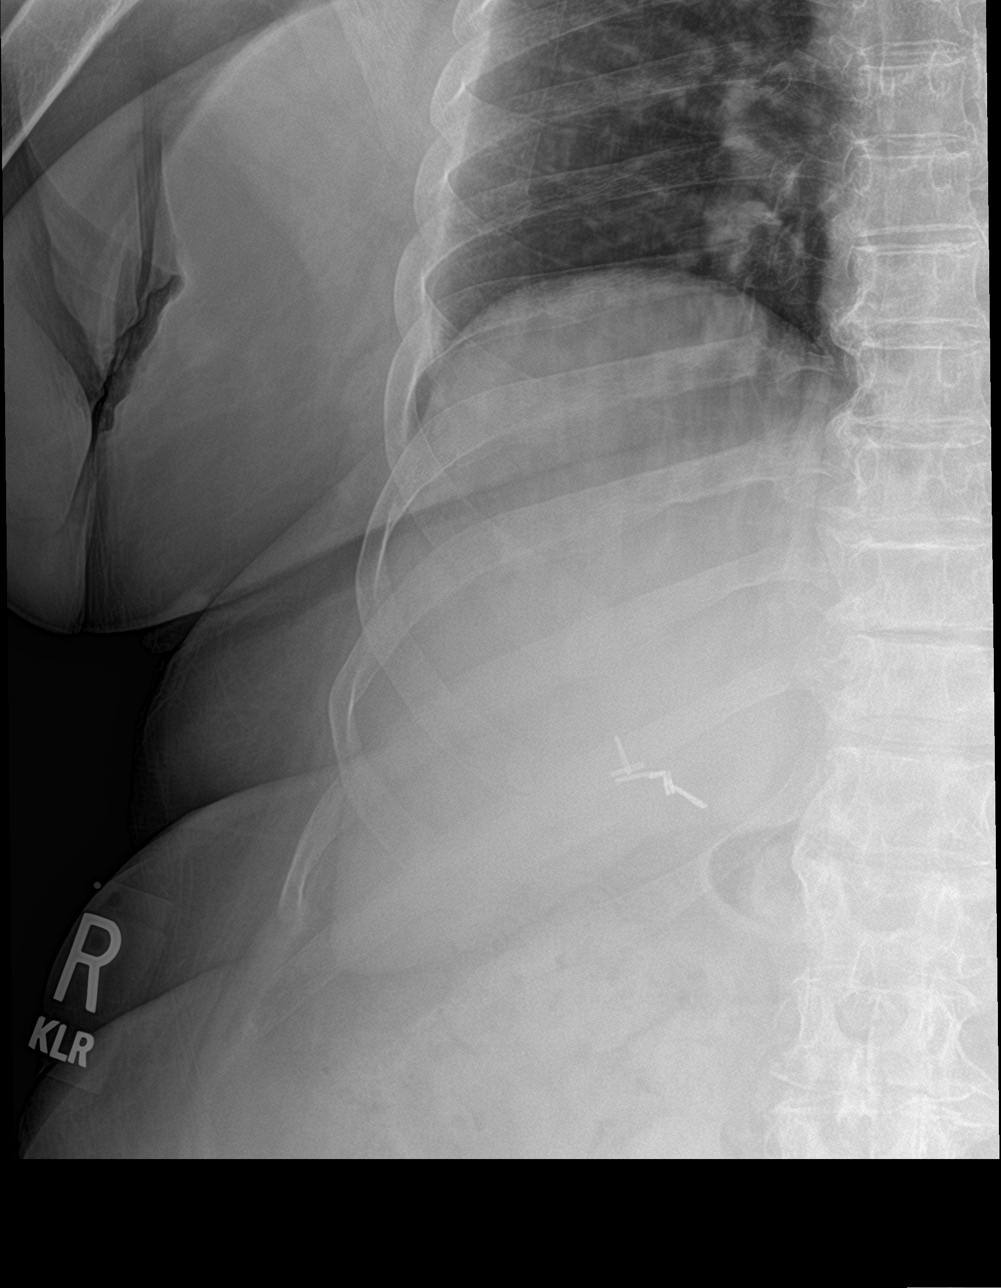

[rib ap obl]
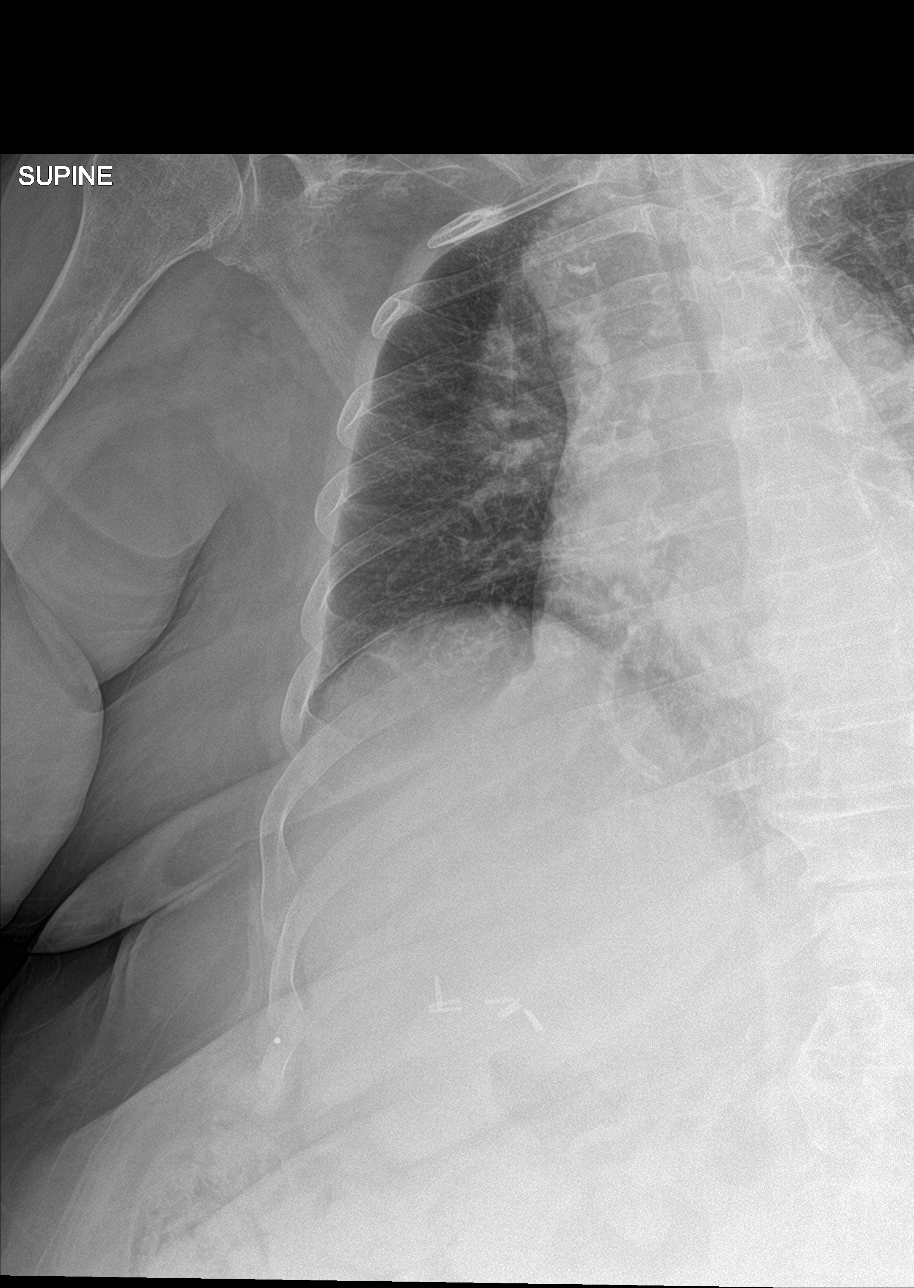

[4 of 4 positions shown; findings below may reference images not displayed]

FINDINGS: Cardiac shadow is within normal limits. Aortic calcifications are
noted. The lungs are well aerated bilaterally. Elevation of the
right hemidiaphragm is seen. No pneumothorax or focal infiltrate is
noted. No acute rib abnormality is noted.
IMPRESSION: No acute rib abnormality noted.

Aortic Atherosclerosis ([id number]).
